# Patient Record
Sex: Female | Born: 1955 | Race: Black or African American | Hispanic: No | Marital: Single | Smoking: Never smoker
Health system: Southern US, Community
[De-identification: ages and names within clinical notes are randomized; demographics above are authoritative.]

## PROBLEM LIST (undated history)

## (undated) DIAGNOSIS — D649 Anemia, unspecified: Secondary | ICD-10-CM

## (undated) DIAGNOSIS — R079 Chest pain, unspecified: Secondary | ICD-10-CM

## (undated) DIAGNOSIS — Z86018 Personal history of other benign neoplasm: Secondary | ICD-10-CM

## (undated) DIAGNOSIS — K219 Gastro-esophageal reflux disease without esophagitis: Secondary | ICD-10-CM

## (undated) DIAGNOSIS — M79605 Pain in left leg: Secondary | ICD-10-CM

## (undated) DIAGNOSIS — M79604 Pain in right leg: Secondary | ICD-10-CM

## (undated) DIAGNOSIS — R0602 Shortness of breath: Secondary | ICD-10-CM

## (undated) DIAGNOSIS — Z8711 Personal history of peptic ulcer disease: Secondary | ICD-10-CM

## (undated) DIAGNOSIS — R519 Headache, unspecified: Secondary | ICD-10-CM

## (undated) DIAGNOSIS — M25474 Effusion, right foot: Secondary | ICD-10-CM

## (undated) DIAGNOSIS — R7303 Prediabetes: Secondary | ICD-10-CM

## (undated) DIAGNOSIS — K5900 Constipation, unspecified: Secondary | ICD-10-CM

## (undated) DIAGNOSIS — M25472 Effusion, left ankle: Secondary | ICD-10-CM

## (undated) DIAGNOSIS — R5383 Other fatigue: Secondary | ICD-10-CM

## (undated) DIAGNOSIS — M25475 Effusion, left foot: Secondary | ICD-10-CM

## (undated) HISTORY — DX: Effusion, left ankle: M25.472

## (undated) HISTORY — DX: Pain in right leg: M79.604

## (undated) HISTORY — DX: Personal history of peptic ulcer disease: Z87.11

## (undated) HISTORY — DX: Headache, unspecified: R51.9

## (undated) HISTORY — DX: Gastro-esophageal reflux disease without esophagitis: K21.9

## (undated) HISTORY — DX: Effusion, right foot: M25.474

## (undated) HISTORY — DX: Pain in right leg: M79.605

## (undated) HISTORY — PX: BREAST LUMPECTOMY: SHX2

## (undated) HISTORY — DX: Prediabetes: R73.03

## (undated) HISTORY — DX: Shortness of breath: R06.02

## (undated) HISTORY — DX: Personal history of other benign neoplasm: Z86.018

## (undated) HISTORY — DX: Constipation, unspecified: K59.00

## (undated) HISTORY — DX: Chest pain, unspecified: R07.9

## (undated) HISTORY — PX: CYSTECTOMY: SUR359

## (undated) HISTORY — DX: Anemia, unspecified: D64.9

## (undated) HISTORY — DX: Other fatigue: R53.83

## (undated) HISTORY — DX: Effusion, left foot: M25.475

## (undated) HISTORY — PX: LAPAROSCOPIC VAGINAL HYSTERECTOMY: SUR798

---

## 1998-04-22 ENCOUNTER — Ambulatory Visit (HOSPITAL_COMMUNITY): Admission: RE | Admit: 1998-04-22 | Discharge: 1998-04-22 | Payer: Self-pay | Admitting: Family Medicine

## 1998-04-22 ENCOUNTER — Encounter: Payer: Self-pay | Admitting: Family Medicine

## 1999-03-15 ENCOUNTER — Encounter (HOSPITAL_BASED_OUTPATIENT_CLINIC_OR_DEPARTMENT_OTHER): Payer: Self-pay | Admitting: General Surgery

## 1999-03-15 ENCOUNTER — Encounter: Admission: RE | Admit: 1999-03-15 | Discharge: 1999-03-15 | Payer: Self-pay | Admitting: General Surgery

## 1999-03-16 ENCOUNTER — Ambulatory Visit (HOSPITAL_BASED_OUTPATIENT_CLINIC_OR_DEPARTMENT_OTHER): Admission: RE | Admit: 1999-03-16 | Discharge: 1999-03-16 | Payer: Self-pay | Admitting: General Surgery

## 2003-10-28 ENCOUNTER — Emergency Department (HOSPITAL_COMMUNITY): Admission: EM | Admit: 2003-10-28 | Discharge: 2003-10-28 | Payer: Self-pay | Admitting: *Deleted

## 2004-08-27 ENCOUNTER — Other Ambulatory Visit: Admission: RE | Admit: 2004-08-27 | Discharge: 2004-08-27 | Payer: Self-pay | Admitting: Obstetrics and Gynecology

## 2005-08-24 ENCOUNTER — Encounter: Admission: RE | Admit: 2005-08-24 | Discharge: 2005-08-24 | Payer: Self-pay | Admitting: Occupational Medicine

## 2013-07-23 ENCOUNTER — Ambulatory Visit (INDEPENDENT_AMBULATORY_CARE_PROVIDER_SITE_OTHER): Payer: BC Managed Care – PPO | Admitting: Gynecology

## 2013-07-23 ENCOUNTER — Encounter: Payer: Self-pay | Admitting: Gynecology

## 2013-07-23 VITALS — BP 126/78 | Resp 18 | Ht 64.5 in | Wt 204.0 lb

## 2013-07-23 DIAGNOSIS — N951 Menopausal and female climacteric states: Secondary | ICD-10-CM

## 2013-07-23 DIAGNOSIS — N898 Other specified noninflammatory disorders of vagina: Secondary | ICD-10-CM

## 2013-07-23 DIAGNOSIS — Z01419 Encounter for gynecological examination (general) (routine) without abnormal findings: Secondary | ICD-10-CM

## 2013-07-23 DIAGNOSIS — Z1211 Encounter for screening for malignant neoplasm of colon: Secondary | ICD-10-CM

## 2013-07-23 LAB — POCT WET PREP (WET MOUNT): Clue Cells Wet Prep Whiff POC: POSITIVE

## 2013-07-23 MED ORDER — VENLAFAXINE HCL ER 37.5 MG PO CP24: 37.5000 mg | ORAL_CAPSULE | Freq: Every day | ORAL | Status: DC

## 2013-07-23 MED ORDER — TINIDAZOLE 500 MG PO TABS: 2.0000 g | ORAL_TABLET | Freq: Once | ORAL | Status: DC

## 2013-07-23 NOTE — Patient Instructions (Signed)

## 2013-07-23 NOTE — Progress Notes (Signed)
58 y.o. Single African American female   G1P1001 here for annual exam. Pt reports menses are absent due to hysterectomy. She does report hot flashes, does have night sweats, does not have vaginal dryness.  She is not using lubricants.  She does not report post-menopasual bleeding.  Pt is s/p LAVH for fibroids but has ovaries in place.  Pt has hot flashes over 10y, never on hormones.    Patient's last menstrual period was 07/24/2003.          Sexually active: yes The current method of family planning is status post hysterectomy.    Exercising: no  The patient does not participate in regular exercise at present. Last pap: 2 years ago Normal  Abnormal PAP: no Mammogram: 2013- Normal  BSE: no Colonoscopy:  None  DEXA: none Alcohol: no  Tobacco: no  Hgb: Work ; Urine: Trace Blood  Health Maintenance  Topic Date Due  . Pap Smear  09/05/1971  . Tetanus/tdap  09/04/1972  . Mammogram  09/05/2003  . Colonoscopy  09/05/2003  . Influenza Vaccine  12/21/2012    Family History  Problem Relation Age of Onset  . Leukemia Maternal Uncle   . Diabetes Maternal Uncle   . Diabetes Mother     There are no active problems to display for this patient.   Past Medical History  Diagnosis Date  . Anemia   . Pre-diabetes   . History of uterine fibroid     Past Surgical History  Procedure Laterality Date  . Laparoscopic vaginal hysterectomy  15 years ago    due to fibroids  . Cystectomy  10 years ago    wrist   . Breast lumpectomy Right 10+ years ago    Benign    Allergies: Review of patient's allergies indicates no known allergies.  Current Outpatient Prescriptions  Medication Sig Dispense Refill  . IBUPROFEN PO Take 200 mg by mouth as needed.        No current facility-administered medications for this visit.    ROS: Pertinent items are noted in HPI.  Exam:    Ht 5' 4.5" (1.638 m)  Wt 204 lb (92.534 kg)  BMI 34.49 kg/m2  LMP 07/24/2003 Weight change: @WEIGHTCHANGE @ Last 3  height recordings:  Ht Readings from Last 3 Encounters:  07/23/13 5' 4.5" (1.638 m)   General appearance: alert, cooperative and appears stated age Head: Normocephalic, without obvious abnormality, atraumatic Neck: no adenopathy, no carotid bruit, no JVD, supple, symmetrical, trachea midline and thyroid not enlarged, symmetric, no tenderness/mass/nodules Lungs: clear to auscultation bilaterally Breasts: normal appearance, no masses or tenderness Heart: regular rate and rhythm, S1, S2 normal, no murmur, click, rub or gallop Abdomen: soft, non-tender; bowel sounds normal; no masses,  no organomegaly Extremities: extremities normal, atraumatic, no cyanosis or edema Skin: Skin color, texture, turgor normal. No rashes or lesions Lymph nodes: Cervical, supraclavicular, and axillary nodes normal. no inguinal nodes palpated Neurologic: Grossly normal   Pelvic: External genitalia:  no lesions              Urethra: normal appearing urethra with no masses, tenderness or lesions              Bartholins and Skenes: Bartholin's, Urethra, Skene's normal                 Vagina: normal appearing vagina with normal color, no lesions, vaginal discharge noted  WET MOUNT done - results: clue cells, pH >5.5  Cervix: absent              Pap taken: no        Bimanual Exam:  Uterus:  absent                                      Adnexa:    no masses                                      Rectovaginal: Confirms                                      Anus:  normal sphincter tone, no lesions  A: well woman BV Menopausal symptoms     P: mammogram pap smear not done Referral for colon cancer screening BV: tindamax We reviewed the recommendations of ACOG and WHI re HRT, risks and benefits reviewed, affects on bone, colon, cholesterol and menopausal symptoms discussed.  We also discussed non-hormonal options such as SSRI's, SSRI/SNRI's such as effexor and mechanism of action.  Questions addressed.  Pt  chooses to try effexor. counseled on breast self exam, mammography screening, adequate intake of calcium and vitamin D, diet and exercise return annually or prn Discussed PAP guideline changes, importance of weight bearing exercises, calcium, vit D and balanced diet.  An After Visit Summary was printed and given to the patient.

## 2013-07-26 ENCOUNTER — Telehealth: Payer: Self-pay | Admitting: Gynecology

## 2013-07-26 NOTE — Telephone Encounter (Signed)
Patient is scheduled with Dr. Loreta AveMann 07/29/2013 @ 3:15. Patient advised.

## 2013-08-27 ENCOUNTER — Other Ambulatory Visit: Payer: Self-pay | Admitting: Family Medicine

## 2013-08-27 ENCOUNTER — Other Ambulatory Visit: Payer: Self-pay | Admitting: Family

## 2013-08-27 ENCOUNTER — Other Ambulatory Visit: Payer: Self-pay | Admitting: Emergency Medicine

## 2013-08-27 DIAGNOSIS — Z1231 Encounter for screening mammogram for malignant neoplasm of breast: Secondary | ICD-10-CM

## 2013-08-27 DIAGNOSIS — Z780 Asymptomatic menopausal state: Secondary | ICD-10-CM

## 2013-09-12 ENCOUNTER — Ambulatory Visit
Admission: RE | Admit: 2013-09-12 | Discharge: 2013-09-12 | Disposition: A | Discharge status: Home / Self Care | Payer: Self-pay | Source: Ambulatory Visit | Attending: Family Medicine | Admitting: Family Medicine

## 2013-09-12 ENCOUNTER — Ambulatory Visit
Admission: RE | Admit: 2013-09-12 | Discharge: 2013-09-12 | Disposition: A | Discharge status: Home / Self Care | Payer: Self-pay | Source: Ambulatory Visit | Attending: Family | Admitting: Family

## 2013-09-12 DIAGNOSIS — Z1231 Encounter for screening mammogram for malignant neoplasm of breast: Secondary | ICD-10-CM

## 2013-09-12 DIAGNOSIS — Z780 Asymptomatic menopausal state: Secondary | ICD-10-CM

## 2014-03-07 ENCOUNTER — Other Ambulatory Visit: Payer: Self-pay

## 2014-03-24 ENCOUNTER — Encounter: Payer: Self-pay | Admitting: Gynecology

## 2014-07-25 ENCOUNTER — Encounter: Payer: Self-pay | Admitting: Obstetrics and Gynecology

## 2014-07-25 ENCOUNTER — Ambulatory Visit: Payer: BC Managed Care – PPO | Admitting: Obstetrics and Gynecology

## 2017-08-06 ENCOUNTER — Ambulatory Visit (HOSPITAL_COMMUNITY)
Admission: EM | Admit: 2017-08-06 | Discharge: 2017-08-06 | Disposition: A | Discharge status: Home / Self Care | Payer: BLUE CROSS/BLUE SHIELD | Attending: Internal Medicine | Admitting: Internal Medicine

## 2017-08-06 ENCOUNTER — Encounter (HOSPITAL_COMMUNITY): Payer: Self-pay | Admitting: *Deleted

## 2017-08-06 DIAGNOSIS — R0981 Nasal congestion: Secondary | ICD-10-CM

## 2017-08-06 DIAGNOSIS — R05 Cough: Secondary | ICD-10-CM

## 2017-08-06 DIAGNOSIS — R059 Cough, unspecified: Secondary | ICD-10-CM

## 2017-08-06 DIAGNOSIS — R062 Wheezing: Secondary | ICD-10-CM

## 2017-08-06 DIAGNOSIS — H109 Unspecified conjunctivitis: Secondary | ICD-10-CM

## 2017-08-06 MED ORDER — AZITHROMYCIN 250 MG PO TABS: ORAL_TABLET | ORAL | 0 refills | Status: DC

## 2017-08-06 MED ORDER — BENZONATATE 100 MG PO CAPS: 100.0000 mg | ORAL_CAPSULE | Freq: Three times a day (TID) | ORAL | 0 refills | Status: DC | PRN

## 2017-08-06 MED ORDER — HYDROCODONE-HOMATROPINE 5-1.5 MG/5ML PO SYRP: 5.0000 mL | ORAL_SOLUTION | Freq: Every evening | ORAL | 0 refills | Status: DC | PRN

## 2017-08-06 MED ORDER — ALBUTEROL SULFATE HFA 108 (90 BASE) MCG/ACT IN AERS: 1.0000 | INHALATION_SPRAY | Freq: Four times a day (QID) | RESPIRATORY_TRACT | 0 refills | Status: DC | PRN

## 2017-08-06 MED ORDER — ERYTHROMYCIN 5 MG/GM OP OINT: TOPICAL_OINTMENT | Freq: Four times a day (QID) | OPHTHALMIC | 0 refills | Status: DC

## 2017-08-06 NOTE — Discharge Instructions (Signed)
Hydrate well with at least 2 liters (1 gallon) of water daily. Take your allergy medications.

## 2017-08-06 NOTE — ED Notes (Signed)
Pt unable to read eye chart, normally wears contacts but took them out.

## 2017-08-06 NOTE — ED Provider Notes (Signed)
  MRN: [id number] DOB: 23-Jan-1956  Subjective:   Julie Flowers is a 62 y.o. female presenting for 3 week history of worsening productive cough that elicits nausea, mild wheezing, now having red right eye, right eye drainage, matted right eye, sinus congestion, sore throat. Has tried Robitussin, Alka-seltzer Plus, Metamucil. Denies history of asthma. Denies smoking cigarettes.   Julie Flowers has No Known Allergies.  Julie Flowers  has a past medical history of Anemia, History of uterine fibroid, and Pre-diabetes. Also  has a past surgical history that includes Laparoscopic vaginal hysterectomy (15 years ago); Cystectomy (10 years ago); and Breast lumpectomy (Right, 10+ years ago).  Objective:   Vitals: BP (!) 120/53 (BP Location: Left Arm)   Pulse 70   Temp 98.8 F (37.1 C) (Oral)   Resp 17   LMP 07/24/2003   SpO2 100%   Physical Exam  Constitutional: She is oriented to person, place, and time. She appears well-developed and well-nourished.  HENT:  Right Ear: Tympanic membrane normal.  Left Ear: Tympanic membrane normal.  Nose: No sinus tenderness.  Mouth/Throat: Oropharynx is clear and moist.  Eyes: Right eye exhibits discharge (with associated injected conjunctiva). Left eye exhibits no discharge.  Neck: Normal range of motion. Neck supple.  Cardiovascular: Normal rate, regular rhythm and intact distal pulses. Exam reveals no gallop and no friction rub.  No murmur heard. Pulmonary/Chest: No respiratory distress. She has no wheezes. She has no rales.  Coarse lung sounds over left lower base.   Musculoskeletal: She exhibits no edema.  Lymphadenopathy:    She has no cervical adenopathy.  Neurological: She is alert and oriented to person, place, and time.  Skin: Skin is warm and dry.  Psychiatric: She has a normal mood and affect.   Assessment and Plan :   Bacterial conjunctivitis of right eye  Cough  Sinus congestion  Wheezing  Start azithromycin for lower respiratory infection. Use  cough suppression medications, albuterol inhaler for wheezing. Start erythromycin for bacterial conjunctivitis of right eye. Return-to-clinic precautions discussed, patient verbalized understanding.   Wallis BambergMani, Naiyah Klostermann, [place]PA-C 08/06/17 2117

## 2017-08-06 NOTE — ED Triage Notes (Signed)
Patient reports she has had nasal congestion/drainage and cough x 3 weeks. This am woke up with redness and drainage to right. Reports blurry vision and burning to right eye.

## 2017-09-02 ENCOUNTER — Other Ambulatory Visit: Payer: Self-pay | Admitting: Urgent Care

## 2017-09-04 NOTE — Telephone Encounter (Signed)
Tried to call pt to schedule an appt to establish care for the refill. Left VM for pt to call the office and schedule an appt.

## 2018-08-13 DIAGNOSIS — H43812 Vitreous degeneration, left eye: Secondary | ICD-10-CM | POA: Diagnosis not present

## 2018-08-13 DIAGNOSIS — H2513 Age-related nuclear cataract, bilateral: Secondary | ICD-10-CM | POA: Diagnosis not present

## 2018-08-13 DIAGNOSIS — H25013 Cortical age-related cataract, bilateral: Secondary | ICD-10-CM | POA: Diagnosis not present

## 2018-12-10 DIAGNOSIS — H5213 Myopia, bilateral: Secondary | ICD-10-CM | POA: Diagnosis not present

## 2018-12-10 DIAGNOSIS — H524 Presbyopia: Secondary | ICD-10-CM | POA: Diagnosis not present

## 2018-12-10 DIAGNOSIS — H04129 Dry eye syndrome of unspecified lacrimal gland: Secondary | ICD-10-CM | POA: Diagnosis not present

## 2018-12-10 DIAGNOSIS — H25813 Combined forms of age-related cataract, bilateral: Secondary | ICD-10-CM | POA: Diagnosis not present

## 2019-06-12 ENCOUNTER — Ambulatory Visit: Payer: BC Managed Care – PPO | Attending: Internal Medicine

## 2019-06-12 DIAGNOSIS — U071 COVID-19: Secondary | ICD-10-CM | POA: Insufficient documentation

## 2019-06-12 DIAGNOSIS — Z20822 Contact with and (suspected) exposure to covid-19: Secondary | ICD-10-CM

## 2019-06-13 LAB — NOVEL CORONAVIRUS, NAA: SARS-CoV-2, NAA: DETECTED — AB

## 2019-06-15 ENCOUNTER — Inpatient Hospital Stay (HOSPITAL_COMMUNITY)
Admission: EM | Admit: 2019-06-15 | Discharge: 2019-06-18 | DRG: 177 | Disposition: A | Discharge status: Home / Self Care | Payer: BC Managed Care – PPO | Attending: Internal Medicine | Admitting: Internal Medicine

## 2019-06-15 ENCOUNTER — Other Ambulatory Visit: Payer: Self-pay

## 2019-06-15 ENCOUNTER — Emergency Department (HOSPITAL_COMMUNITY): Payer: BC Managed Care – PPO

## 2019-06-15 DIAGNOSIS — N179 Acute kidney failure, unspecified: Secondary | ICD-10-CM | POA: Diagnosis present

## 2019-06-15 DIAGNOSIS — J45909 Unspecified asthma, uncomplicated: Secondary | ICD-10-CM | POA: Diagnosis not present

## 2019-06-15 DIAGNOSIS — Z833 Family history of diabetes mellitus: Secondary | ICD-10-CM

## 2019-06-15 DIAGNOSIS — J9601 Acute respiratory failure with hypoxia: Secondary | ICD-10-CM | POA: Diagnosis present

## 2019-06-15 DIAGNOSIS — R0902 Hypoxemia: Secondary | ICD-10-CM

## 2019-06-15 DIAGNOSIS — J069 Acute upper respiratory infection, unspecified: Secondary | ICD-10-CM | POA: Diagnosis present

## 2019-06-15 DIAGNOSIS — R430 Anosmia: Secondary | ICD-10-CM | POA: Diagnosis not present

## 2019-06-15 DIAGNOSIS — R0602 Shortness of breath: Secondary | ICD-10-CM | POA: Diagnosis not present

## 2019-06-15 DIAGNOSIS — I10 Essential (primary) hypertension: Secondary | ICD-10-CM | POA: Diagnosis present

## 2019-06-15 DIAGNOSIS — J4522 Mild intermittent asthma with status asthmaticus: Secondary | ICD-10-CM | POA: Diagnosis not present

## 2019-06-15 DIAGNOSIS — U071 COVID-19: Principal | ICD-10-CM | POA: Diagnosis present

## 2019-06-15 DIAGNOSIS — Z6835 Body mass index (BMI) 35.0-35.9, adult: Secondary | ICD-10-CM | POA: Diagnosis not present

## 2019-06-15 DIAGNOSIS — E669 Obesity, unspecified: Secondary | ICD-10-CM | POA: Diagnosis not present

## 2019-06-15 DIAGNOSIS — R509 Fever, unspecified: Secondary | ICD-10-CM | POA: Diagnosis not present

## 2019-06-15 DIAGNOSIS — E860 Dehydration: Secondary | ICD-10-CM | POA: Diagnosis not present

## 2019-06-15 DIAGNOSIS — J1282 Pneumonia due to coronavirus disease 2019: Secondary | ICD-10-CM | POA: Diagnosis present

## 2019-06-15 LAB — C-REACTIVE PROTEIN: CRP: 25.3 mg/dL — ABNORMAL HIGH (ref <1.0)

## 2019-06-15 LAB — COMPREHENSIVE METABOLIC PANEL
ALT: 16 U/L (ref 0–44)
AST: 32 U/L (ref 15–41)
Albumin: 3 g/dL — ABNORMAL LOW (ref 3.5–5.0)
Alkaline Phosphatase: 62 U/L (ref 38–126)
Anion gap: 13 (ref 5–15)
BUN: 10 mg/dL (ref 8–23)
CO2: 24 mmol/L (ref 22–32)
Calcium: 9 mg/dL (ref 8.9–10.3)
Chloride: 104 mmol/L (ref 98–111)
Creatinine, Ser: 1.11 mg/dL — ABNORMAL HIGH (ref 0.44–1.00)
GFR calc Af Amer: >60 mL/min (ref >60)
GFR calc non Af Amer: 52 mL/min — ABNORMAL LOW (ref >60)
Glucose, Bld: 135 mg/dL — ABNORMAL HIGH (ref 70–99)
Potassium: 3.7 mmol/L (ref 3.5–5.1)
Sodium: 141 mmol/L (ref 135–145)
Total Bilirubin: 0.7 mg/dL (ref 0.3–1.2)
Total Protein: 8 g/dL (ref 6.5–8.1)

## 2019-06-15 LAB — CBC WITH DIFFERENTIAL/PLATELET
Abs Immature Granulocytes: 0.25 10*3/uL — ABNORMAL HIGH (ref 0.00–0.07)
Basophils Absolute: 0.1 10*3/uL (ref 0.0–0.1)
Basophils Relative: 0 %
Eosinophils Absolute: 0 10*3/uL (ref 0.0–0.5)
Eosinophils Relative: 0 %
HCT: 38.1 % (ref 36.0–46.0)
Hemoglobin: 12.8 g/dL (ref 12.0–15.0)
Immature Granulocytes: 2 %
Lymphocytes Relative: 12 %
Lymphs Abs: 1.6 10*3/uL (ref 0.7–4.0)
MCH: 29.2 pg (ref 26.0–34.0)
MCHC: 33.6 g/dL (ref 30.0–36.0)
MCV: 87 fL (ref 80.0–100.0)
Monocytes Absolute: 0.5 10*3/uL (ref 0.1–1.0)
Monocytes Relative: 4 %
Neutro Abs: 11.3 10*3/uL — ABNORMAL HIGH (ref 1.7–7.7)
Neutrophils Relative %: 82 %
Platelets: 320 10*3/uL (ref 150–400)
RBC: 4.38 MIL/uL (ref 3.87–5.11)
RDW: 13.5 % (ref 11.5–15.5)
WBC: 13 10*3/uL — ABNORMAL HIGH (ref 4.0–10.5)
nRBC: 0 % (ref 0.0–0.2)

## 2019-06-15 LAB — TRIGLYCERIDES: Triglycerides: 129 mg/dL (ref <150)

## 2019-06-15 LAB — D-DIMER, QUANTITATIVE (NOT AT ARMC): D-Dimer, Quant: 2.34 ug/mL-FEU — ABNORMAL HIGH (ref 0.00–0.50)

## 2019-06-15 LAB — FIBRINOGEN: Fibrinogen: >800 mg/dL — ABNORMAL HIGH (ref 210–475)

## 2019-06-15 LAB — LACTIC ACID, PLASMA
Lactic Acid, Venous: 1.6 mmol/L (ref 0.5–1.9)
Lactic Acid, Venous: 0.9 mmol/L (ref 0.5–1.9)

## 2019-06-15 LAB — ABO/RH: ABO/RH(D): O POS

## 2019-06-15 LAB — LACTATE DEHYDROGENASE: LDH: 360 U/L — ABNORMAL HIGH (ref 98–192)

## 2019-06-15 LAB — PROCALCITONIN: Procalcitonin: 0.12 ng/mL

## 2019-06-15 MED ORDER — DEXAMETHASONE SODIUM PHOSPHATE 10 MG/ML IJ SOLN: 6.0000 mg | INTRAMUSCULAR | Status: DC

## 2019-06-15 MED ORDER — SODIUM CHLORIDE 0.9 % IV SOLN
100.0000 mg | Freq: Every day | INTRAVENOUS | Status: DC
  Administered 2019-06-16: 100 mg via INTRAVENOUS
  Filled 2019-06-15 (×2): qty 20

## 2019-06-15 MED ORDER — SODIUM CHLORIDE 0.9% FLUSH
3.0000 mL | Freq: Two times a day (BID) | INTRAVENOUS | Status: DC
  Administered 2019-06-16 – 2019-06-18 (×6): 3 mL via INTRAVENOUS

## 2019-06-15 MED ORDER — ENOXAPARIN SODIUM 40 MG/0.4ML ~~LOC~~ SOLN: 40.0000 mg | SUBCUTANEOUS | Status: DC

## 2019-06-15 MED ORDER — ALBUTEROL SULFATE HFA 108 (90 BASE) MCG/ACT IN AERS
1.0000 | INHALATION_SPRAY | Freq: Four times a day (QID) | RESPIRATORY_TRACT | Status: DC | PRN
  Filled 2019-06-15: qty 6.7

## 2019-06-15 MED ORDER — SODIUM CHLORIDE 0.9 % IV SOLN
1.0000 g | Freq: Once | INTRAVENOUS | Status: AC
  Administered 2019-06-15: 22:00:00 1 g via INTRAVENOUS
  Filled 2019-06-15: qty 10

## 2019-06-15 MED ORDER — ACETAMINOPHEN 500 MG PO TABS
1000.0000 mg | ORAL_TABLET | Freq: Once | ORAL | Status: AC
  Administered 2019-06-15: 21:00:00 1000 mg via ORAL
  Filled 2019-06-15: qty 2

## 2019-06-15 MED ORDER — VENLAFAXINE HCL ER 37.5 MG PO CP24
37.5000 mg | ORAL_CAPSULE | Freq: Every day | ORAL | Status: DC
  Administered 2019-06-16 – 2019-06-18 (×3): 37.5 mg via ORAL
  Filled 2019-06-15 (×4): qty 1

## 2019-06-15 MED ORDER — ZINC SULFATE 220 (50 ZN) MG PO CAPS
220.0000 mg | ORAL_CAPSULE | Freq: Every day | ORAL | Status: DC
  Administered 2019-06-16 – 2019-06-18 (×3): 220 mg via ORAL
  Filled 2019-06-15 (×3): qty 1

## 2019-06-15 MED ORDER — SODIUM CHLORIDE 0.9 % IV SOLN
500.0000 mg | Freq: Once | INTRAVENOUS | Status: AC
  Administered 2019-06-15: 500 mg via INTRAVENOUS
  Filled 2019-06-15: qty 500

## 2019-06-15 MED ORDER — LACTATED RINGERS IV BOLUS
500.0000 mL | Freq: Once | INTRAVENOUS | Status: AC
  Administered 2019-06-15: 500 mL via INTRAVENOUS

## 2019-06-15 MED ORDER — HYDROCODONE-ACETAMINOPHEN 5-325 MG PO TABS: 1.0000 | ORAL_TABLET | ORAL | Status: DC | PRN

## 2019-06-15 MED ORDER — ACETAMINOPHEN 325 MG PO TABS: 650.0000 mg | ORAL_TABLET | Freq: Four times a day (QID) | ORAL | Status: DC | PRN

## 2019-06-15 MED ORDER — SODIUM CHLORIDE 0.9 % IV SOLN: INTRAVENOUS | Status: AC

## 2019-06-15 MED ORDER — ONDANSETRON HCL 4 MG/2ML IJ SOLN: 4.0000 mg | Freq: Four times a day (QID) | INTRAMUSCULAR | Status: DC | PRN

## 2019-06-15 MED ORDER — GUAIFENESIN-DM 100-10 MG/5ML PO SYRP
10.0000 mL | ORAL_SOLUTION | ORAL | Status: DC | PRN
  Administered 2019-06-16 – 2019-06-17 (×3): 10 mL via ORAL
  Filled 2019-06-15 (×3): qty 10

## 2019-06-15 MED ORDER — SODIUM CHLORIDE 0.9 % IV SOLN
200.0000 mg | Freq: Once | INTRAVENOUS | Status: AC
  Administered 2019-06-15: 200 mg via INTRAVENOUS
  Filled 2019-06-15: qty 40

## 2019-06-15 MED ORDER — ASCORBIC ACID 500 MG PO TABS
500.0000 mg | ORAL_TABLET | Freq: Every day | ORAL | Status: DC
  Administered 2019-06-16 – 2019-06-18 (×3): 500 mg via ORAL
  Filled 2019-06-15 (×3): qty 1

## 2019-06-15 MED ORDER — DEXAMETHASONE SODIUM PHOSPHATE 10 MG/ML IJ SOLN
10.0000 mg | Freq: Once | INTRAMUSCULAR | Status: AC
  Administered 2019-06-15: 10 mg via INTRAVENOUS
  Filled 2019-06-15: qty 1

## 2019-06-15 MED ORDER — ONDANSETRON HCL 4 MG PO TABS: 4.0000 mg | ORAL_TABLET | Freq: Four times a day (QID) | ORAL | Status: DC | PRN

## 2019-06-15 NOTE — H&P (Signed)
Julie Flowers HGD:[id number] DOB: 04/27/1956 DOA: 06/15/2019     PCP: Patient, No Pcp Per   Outpatient Specialists:  NONE    Patient arrived to ER on 06/15/19 at 2015  Patient coming from: home Lives  With family    Chief Complaint:  Cough shortness of breath fever HPI: Julie Flowers is a 64 y.o. 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  Bilateral infiltrates on chest x-ray consistent with COVID-19   Pneumonia    WBC 13  The following Work up has been ordered so far:  Orders Placed This Encounter  Procedures  . Blood Culture (routine x 2)  . DG Chest Port 1 View  . Lactic acid, plasma  . CBC WITH DIFFERENTIAL  . Comprehensive metabolic panel  . D-dimer, quantitative  . Procalcitonin  . Lactate dehydrogenase  . Triglycerides  . Fibrinogen  . C-reactive protein  . Diet NPO time specified  . Cardiac monitoring  . Insert peripheral IV x 2  . Initiate Carrier Fluid Protocol  . Place surgical mask on patient  . Patient to wear surgical mask during transportation  . Assess patient for ability  to self-prone. If able (can move self in bed, ambulate) and stable (SpO2 and oxygen requirement):  . RN/NT - Document specific oxygen requirements in CHL  . Notify EDP if new oxygen requirements escalates > 4L per minute [place]  . RN to draw the following extra tubes:  . remdesivir per pharmacy consult  . Consult to hospitalist  ALL PATIENTS BEING ADMITTED/HAVING PROCEDURES NEED COVID-19 SCREENING  . Airborne and Contact precautions  . Pulse oximetry, continuous  . I-Stat arterial blood gas, ED  . ED EKG 12-Lead     Following Medications were ordered in ER: Medications  remdesivir 200 mg in sodium chloride 0.9% 250 mL IVPB (0 mg Intravenous Stopped 06/15/19 2211)    Followed by  remdesivir 100 mg in sodium chloride 0.9 % 100 mL IVPB (has no administration in time range)  cefTRIAXone (ROCEPHIN) 1 g in sodium chloride 0.9 % 100 mL IVPB (1 g Intravenous New Bag/Given 06/15/19 2220)  azithromycin (ZITHROMAX) 500 mg in sodium chloride 0.9 % 250 mL IVPB (500 mg Intravenous New Bag/Given 06/15/19 2220)  acetaminophen (TYLENOL) tablet 1,000 mg (1,000 mg Oral Given 06/15/19 2043)  lactated ringers bolus 500 mL (0 mLs Intravenous Stopped 06/15/19 2211)  dexamethasone (DECADRON) injection 10 mg (10 mg Intravenous Given 06/15/19 2118)        Consult Orders  (From admission, onward)  Start     Ordered   06/15/19 2210  Consult to hospitalist  ALL PATIENTS BEING ADMITTED/HAVING PROCEDURES NEED COVID-19 SCREENING Paged [facility], roxanne  Once    Comments: ALL PATIENTS BEING ADMITTED/HAVING PROCEDURES NEED COVID-19 SCREENING  Provider:  (Not yet assigned)  Question Answer Comment  Place call to: [facility]   Reason for Consult Admit      06/15/19 2209          Significant initial  Findings: Abnormal Labs Reviewed  CBC WITH DIFFERENTIAL/PLATELET - Abnormal; Notable for the following components:      Result Value   WBC 13.0 (*)    All other components within normal limits   COMPREHENSIVE METABOLIC PANEL - Abnormal; Notable for the following components:   Glucose, Bld 135 (*)    Creatinine, Ser 1.11 (*)    Albumin 3.0 (*)    GFR calc non Af Amer 52 (*)    All other components within normal limits  D-DIMER, QUANTITATIVE (NOT AT [facility]) - Abnormal; Notable for the following components:   D-Dimer, Quant 2.34 (*)    All other components within normal limits  LACTATE DEHYDROGENASE - Abnormal; Notable for the following components:   LDH 360 (*)    All other components within normal limits  FIBRINOGEN - Abnormal; Notable for the following components:   Fibrinogen >800 (*)    All other components within normal limits    Otherwise labs showing:    Recent Labs  Lab 06/15/19 2033  NA 141  K 3.7  CO2 24  GLUCOSE 135*  BUN 10  CREATININE 1.11*  CALCIUM 9.0    Cr    Lab Results  Component Value Date   CREATININE 1.11 (H) 06/15/2019    Recent Labs  Lab 06/15/19 2033  AST 32  ALT 16  ALKPHOS 62  BILITOT 0.7  PROT 8.0  ALBUMIN 3.0*   Lab Results  Component Value Date   CALCIUM 9.0 06/15/2019      WBC      Component Value Date/Time   WBC 13.0 (H) 06/15/2019 2033   ANC    Component Value Date/Time   NEUTROABS PENDING 06/15/2019 2033   ALC No components found for: LYMPHAB    Plt: Lab Results  Component Value Date   PLT 320 06/15/2019     Lactic Acid, Venous    Component Value Date/Time   LATICACIDVEN 1.6 06/15/2019 2033    Procalcitonin 0.12  COVID-19 Labs  Recent Labs    06/15/19 2033  DDIMER 2.34*  LDH 360*    Lab Results  Component Value Date   SARSCOV2NAA Detected (A) 06/12/2019     HG/HCT  stable,      Component Value Date/Time   HGB 12.8 06/15/2019 2033   HCT 38.1 06/15/2019 2033     Troponin 7     ECG: Ordered Personally reviewed by me showing: HR :74 Rhythm:  NSR,     no evidence of ischemic changes QTC 434    UA not ordered      Ordered   CXR - COVID-19   Pneumonia     ED Triage  Vitals  Enc Vitals Group     BP 06/15/19 2024 (!) 143/63     Pulse --      Resp 06/15/19 2024 (!) 24     Temp 06/15/19 2024 (!) 101.8 F (38.8 C)     Temp Source 06/15/19 2024 Oral     SpO2 06/15/19 2024 95 %  Weight 06/15/19 2025 214 lb (97.1 kg)     Height 06/15/19 2025 5' 4.75" (1.645 m)     Head Circumference --      Peak Flow --      Pain Score --      Pain Loc --      Pain Edu? --      Excl. in GC? --   TMAX(24)@       Latest  Blood pressure (!) 143/63, temperature (!) 101.8 F (38.8 C), temperature source Oral, resp. rate (!) 24, height 5' 4.75" (1.645 m), weight 97.1 kg, last menstrual period 07/24/2003, SpO2 95 %.    Hospitalist was called for admission for acute respiratory failure with hypoxia in the setting of Covid pneumonia   Review of Systems:    Pertinent positives include:  Fevers, chills, fatigue  shortness of breath at rest.   dyspnea on exertion,  Constitutional:  No weight loss, night sweats, weight loss  HEENT:  No headaches, Difficulty swallowing,Tooth/dental problems,Sore throat,  No sneezing, itching, ear ache, nasal congestion, post nasal drip,  Cardio-vascular:  No chest pain, Orthopnea, PND, anasarca, dizziness, palpitations.no Bilateral lower extremity swelling  GI:  No heartburn, indigestion, abdominal pain, nausea, vomiting, diarrhea, change in bowel habits, loss of appetite, melena, blood in stool, hematemesis Resp:  no  No excess mucus, no productive cough, No non-productive cough, No coughing up of blood.No change in color of mucus.No wheezing. Skin:  no rash or lesions. No jaundice GU:  no dysuria, change in color of urine, no urgency or frequency. No straining to urinate.  No flank pain.  Musculoskeletal:  No joint pain or no joint swelling. No decreased range of motion. No back pain.  Psych:  No change in mood or affect. No depression or anxiety. No memory loss.  Neuro: no localizing neurological complaints, no tingling, no  weakness, no double vision, no gait abnormality, no slurred speech, no confusion  All systems reviewed and apart from HOPI all are negative  Past Medical History:   Past Medical History:  Diagnosis Date  . Anemia   . History of uterine fibroid   . Pre-diabetes      Past Surgical History:  Procedure Laterality Date  . BREAST LUMPECTOMY Right 10+ years ago   Benign  . CYSTECTOMY  10 years ago   wrist   . LAPAROSCOPIC VAGINAL HYSTERECTOMY  15 years ago   due to fibroids    Social History:  Ambulatory  independently       reports that she has never smoked. She does not have any smokeless tobacco history on file. No history on file for alcohol and drug.   Family History:   Family History  Problem Relation Age of Onset  . Diabetes Mother   . Leukemia Maternal Uncle   . Diabetes Maternal Uncle     Allergies: No Known Allergies   Prior to Admission medications   Medication Sig Start Date End Date Taking? Authorizing Provider  albuterol (PROVENTIL HFA;VENTOLIN HFA) 108 (90 Base) MCG/ACT inhaler Inhale 1-2 puffs into the lungs every 6 (six) hours as needed for wheezing or shortness of breath. 08/06/17   Wallis Bamberg, PA-C  azithromycin (ZITHROMAX) 250 MG tablet Start with 2 tablets today, then 1 daily thereafter. 08/06/17   Wallis Bamberg, PA-C  benzonatate (TESSALON) 100 MG capsule Take 1-2 capsules (100-200 mg total) by mouth 3 (three) times daily as needed for cough. 08/06/17   Wallis Bamberg, PA-C  erythromycin ophthalmic ointment Place into  the right eye 4 (four) times daily. 08/06/17   Jaynee Eagles, PA-C  HYDROcodone-homatropine [facility]) 5-1.5 MG/5ML syrup Take 5 mLs by mouth at bedtime as needed. 08/06/17   Jaynee Eagles, PA-C  IBUPROFEN PO Take 200 mg by mouth as needed.     [provider]  tinidazole (TINDAMAX) 500 MG tablet Take 4 tablets (2,000 mg total) by mouth once. Repeat in 1d 07/23/13   Elveria Rising, MD  venlafaxine XR (EFFEXOR XR) 37.5 MG 24 hr capsule Take 1  capsule (37.5 mg total) by mouth daily with breakfast. May increase to 2 tablets after 2w 07/23/13   Elveria Rising, MD   Physical Exam: Blood pressure (!) 143/63, temperature (!) 101.8 F (38.8 C), temperature source Oral, resp. rate (!) 24, height 5' 4.75" (1.645 m), weight 97.1 kg, last menstrual period 07/24/2003, SpO2 95 %. 1. General:  in No  Acute distress   acutely ill -appearing 2. Psychological: Alert and    Oriented 3. Head/ENT:     Dry Mucous Membranes                          Head Non traumatic, neck supple                            Poor Dentition 4. SKIN:   decreased Skin turgor,  Skin clean Dry and intact no rash 5. Heart: Regular rate and rhythm no  Murmur, no Rub or gallop 6. Lungs:   no wheezes or crackles   7. Abdomen: Soft,  non-tender, Non distended  obese  bowel sounds present 8. Lower extremities: no clubbing, cyanosis, no edema 9. Neurologically Grossly intact, moving all 4 extremities equally   10. MSK: Normal range of motion   All other LABS:     Recent Labs  Lab 06/15/19 2033  WBC 13.0*  NEUTROABS PENDING  HGB 12.8  HCT 38.1  MCV 87.0  PLT 320     Recent Labs  Lab 06/15/19 2033  NA 141  K 3.7  CL 104  CO2 24  GLUCOSE 135*  BUN 10  CREATININE 1.11*  CALCIUM 9.0     Recent Labs  Lab 06/15/19 2033  AST 32  ALT 16  ALKPHOS 62  BILITOT 0.7  PROT 8.0  ALBUMIN 3.0*       Cultures: No results found for: SDES, SPECREQUEST, CULT, REPTSTATUS   Radiological Exams on Admission: DG Chest Port 1 View  Result Date: 06/15/2019 CLINICAL DATA:  Fever and shortness of breath. COVID positive 3 days ago. EXAM: PORTABLE CHEST 1 VIEW COMPARISON:  Radiograph 08/24/2005 FINDINGS: Low lung volumes. Patchy bilateral heterogeneous airspace opacities, mid lower lung zone predominant. Heart is normal in size for technique and inspiration. No large pleural effusion. No pneumothorax. No acute osseous abnormalities are seen. IMPRESSION: Patchy bilateral  heterogeneous airspace opacities, consistent with COVID pneumonia. Electronically Signed   By: Keith Rake M.D.   On: 06/15/2019 21:57    Chart has been reviewed    Assessment/Plan   64 y.o. female with medical history significant of Covid infection positive on 06/12/2019, obesity, asthma Admitted for acute respiratory failure hypoxia secondary to Covid pneumonia  Present on Admission: . Pneumonia due to COVID-19 virus -  FROM HOME  WITH KNOWN HX OF COVID19 ER Novel Corona Virus testing:  Ordered 06/15/19 and is pending    Following concerning LAB/ imaging findings:    ANC/ALC ratio>3.5  BMP: increased BUN/Cr      CRP, LDH: increased  IL-6 and Ferritin increased   Procalcitonin: low   CXR: hazy bilateral peripheral opacities       -Following work-up initiated:      sputum cultures Ordered 06/15/19,     Following complications noted:   evidence of AKI - will provide gentle rehydration   acute respiratory failure with hypoxia - continue oxygen treatment  Plan of treatment: - Transfer to [facility] facility if   bed is available and meets criteria for transfer  Spoke to Dr. Dwana Curd -given severity of illness initiate steroids Decadron 6mg  q 24 hours And pharmacy consult for remdesivir -   IF Hypoxia requiring high nasal flow or rapidly progressing hypoxia   will attempt a trial of Actemra (discussed with patient risks and benefits) no contraindications noted at this time - Will follow daily d.dimer - Assess for ability to prone  - Supportive management -Fluid sparing resuscitation  -Provide oxygen as needed currently on   SpO2: 98 %(@4L  O2 on [place]) O2 Flow Rate (L/min): 4 L/min - IF d.dimer elvated >5 will increase dose of lovenox In emergency department given IV antibiotics given slight elevation white blood cell count will observe wait for results of procalcitonin if significantly elevated would continue otherwise will hold off for now  Poor Prognostic factors  64 y.o.   Personal hx of  obesity , asthma Evidence of  organ damage  Present   AKI, respiratory failure requiring >4L [place]  tachypnea, tachycardia present on admission  ABS neutrophil to lymphocyte ratio >3.5     Will order Airborne and Contact precautions  Family/ patient prognosis discussion: I have discussed case with the patient  who are aware of their prognosis At this point they would like  to be full code      . Acute respiratory disease due to COVID-19 virus -provide oxygen as needed change to high flow if necessary  . AKI (acute kidney injury) (HCC) -mild we will rehydrate  . Dehydration - gentle IV fluids   Other plan as per orders.  DVT prophylaxis:   Lovenox     Code Status:  FULL CODE  as per patient   I had personally discussed CODE STATUS with patient    Family Communication:   Family not at  Bedside    Disposition Plan:    To home once workup is complete and patient is stable                      Consults called: none   Admission status:  ED Disposition    None        inpatient     Expect 2 midnight stay secondary to severity of patient's current illness including   hemodynamic instability despite optimal treatment ( hypoxia )   Severe lab/radiological/exam abnormalities including:    COVID PNA and extensive comorbidities including:   Obesity .    That are currently affecting medical management.   I expect  patient to be hospitalized for 2 midnights requiring inpatient medical care.  Patient is at high risk for adverse outcome (such as loss of life or disability) if not treated.  Indication for inpatient stay as follows:    New or worsening hypoxia  Need for IV antivirals, IV steroids      Level of care    tele    indefinitely please discontinue once patient no longer qualifies   Precautions: admitted as  covid positive Airborne and Contact precautions   PPE: Used by the provider:   P100  eye Goggles,  Gloves  gown     Jagar Lua 06/16/2019, 1:14 AM    [facility]     after 2 AM please page floor coverage PA If 7AM-7PM, please contact the day team taking care of the patient using [email or web address]   Patient was evaluated in the context of the global COVID-19 pandemic, which necessitated consideration that the patient might be at risk for infection with the SARS-CoV-2 virus that causes COVID-19. Institutional protocols and algorithms that pertain to the evaluation of patients at risk for COVID-19 are in a state of rapid change based on information released by regulatory bodies including the CDC and federal and state organizations. These policies and algorithms were followed during the patient's care.

## 2019-06-15 NOTE — ED Provider Notes (Signed)
[facility] EMERGENCY DEPARTMENT Provider Note   CSN: [id number] Arrival date & time: 06/15/19  2015     History No chief complaint on file.   Julie Flowers is a 64 y.o. female.  HPI 64 year old female presented to the ED for shortness of breath, fever, malaise and fatigue over the past 4 days.  Patient tested positive for Covid 3 days ago, states that her symptoms have worsened over the past 3 days.  Called EMS today for shortness of breath, on arrival to the ED patient was hypoxic on room air, placed on 4 L nasal cannula with improvement.  Patient reports overall feeling ill, denies any chest pain, does report some vomiting as well but no diarrhea.  Patient does not wear home oxygen, states that she feels better with the oxygen on currently.  Denies any abdominal pain, leg swelling, headaches or neurological symptoms.    Past Medical History:  Diagnosis Date  . Anemia   . History of uterine fibroid   . Pre-diabetes     Patient Active Problem List   Diagnosis Date Noted  . Pneumonia due to COVID-19 virus 06/15/2019  . Acute respiratory disease due to COVID-19 virus 06/15/2019  . AKI (acute kidney injury) (HCC) 06/15/2019  . Dehydration 06/15/2019    Past Surgical History:  Procedure Laterality Date  . BREAST LUMPECTOMY Right 10+ years ago   Benign  . CYSTECTOMY  10 years ago   wrist   . LAPAROSCOPIC VAGINAL HYSTERECTOMY  15 years ago   due to fibroids     OB History    Gravida  1   Para  1   Term  1   Preterm      AB      Living  1     SAB      TAB      Ectopic      Multiple      Live Births  1           Family History  Problem Relation Age of Onset  . Diabetes Mother   . Leukemia Maternal Uncle   . Diabetes Maternal Uncle     Social History   Tobacco Use  . Smoking status: Never Smoker  Substance Use Topics  . Alcohol use: Not on file  . Drug use: Not on file    Home Medications Prior to Admission  medications   Medication Sig Start Date End Date Taking? Authorizing Provider  albuterol (PROVENTIL HFA;VENTOLIN HFA) 108 (90 Base) MCG/ACT inhaler Inhale 1-2 puffs into the lungs every 6 (six) hours as needed for wheezing or shortness of breath. 08/06/17   Wallis Bamberg, PA-C  azithromycin (ZITHROMAX) 250 MG tablet Start with 2 tablets today, then 1 daily thereafter. 08/06/17   Wallis Bamberg, PA-C  benzonatate (TESSALON) 100 MG capsule Take 1-2 capsules (100-200 mg total) by mouth 3 (three) times daily as needed for cough. 08/06/17   Wallis Bamberg, PA-C  erythromycin ophthalmic ointment Place into the right eye 4 (four) times daily. 08/06/17   Wallis Bamberg, PA-C  HYDROcodone-homatropine [facility]) 5-1.5 MG/5ML syrup Take 5 mLs by mouth at bedtime as needed. 08/06/17   Wallis Bamberg, PA-C  IBUPROFEN PO Take 200 mg by mouth as needed.     [provider]  tinidazole (TINDAMAX) 500 MG tablet Take 4 tablets (2,000 mg total) by mouth once. Repeat in 1d 07/23/13   Douglass Rivers, MD  venlafaxine XR (EFFEXOR XR) 37.5 MG 24 hr capsule Take  1 capsule (37.5 mg total) by mouth daily with breakfast. May increase to 2 tablets after 2w 07/23/13   Douglass Rivers, MD    Allergies    Patient has no known allergies.  Review of Systems   Review of Systems  Constitutional: Positive for chills, fatigue and fever.  HENT: Negative for ear pain and sore throat.   Eyes: Negative for pain and visual disturbance.  Respiratory: Positive for cough and shortness of breath.   Cardiovascular: Negative for chest pain and palpitations.  Gastrointestinal: Positive for nausea. Negative for abdominal pain and vomiting.  Genitourinary: Negative for dysuria and hematuria.  Musculoskeletal: Negative for arthralgias and back pain.  Skin: Negative for color change and rash.  Neurological: Negative for seizures and syncope.  All other systems reviewed and are negative.   Physical Exam Updated Vital Signs BP (!) 153/83 Comment: @4L  O2  on [place]  Pulse 76 Comment: @4L  O2 on [place]  Temp (!) 101.8 F (38.8 C) (Oral)   Resp (!) 28 Comment: @4L  O2 on [place]  Ht 5' 4.75" (1.645 m)   Wt 97.1 kg   LMP 07/24/2003   SpO2 98% Comment: @4L  O2 on [place]  BMI 35.89 kg/m   Physical Exam Vitals and nursing note reviewed.  Constitutional:      General: She is not in acute distress.    Appearance: Normal appearance. She is well-developed. She is ill-appearing.  HENT:     Head: Normocephalic and atraumatic.     Right Ear: External ear normal.     Nose: Nose normal. No congestion.  Eyes:     Conjunctiva/sclera: Conjunctivae normal.  Cardiovascular:     Rate and Rhythm: Regular rhythm. Tachycardia present.     Heart sounds: No murmur.  Pulmonary:     Effort: Pulmonary effort is normal. No respiratory distress.     Breath sounds: Rhonchi present.  Abdominal:     Palpations: Abdomen is soft.     Tenderness: There is no abdominal tenderness. There is no guarding or rebound.     Hernia: No hernia is present.  Musculoskeletal:        General: No swelling, tenderness, deformity or signs of injury. Normal range of motion.     Cervical back: Normal range of motion and neck supple. No rigidity.  Skin:    General: Skin is warm and dry.     Capillary Refill: Capillary refill takes less than 2 seconds.  Neurological:     General: No focal deficit present.     Mental Status: She is alert and oriented to person, place, and time.  Psychiatric:        Mood and Affect: Mood normal.     ED Results / Procedures / Treatments   Labs (all labs ordered are listed, but only abnormal results are displayed) Labs Reviewed  CBC WITH DIFFERENTIAL/PLATELET - Abnormal; Notable for the following components:      Result Value   WBC 13.0 (*)    Neutro Abs 11.3 (*)    Abs Immature Granulocytes 0.25 (*)    All other components within normal limits  COMPREHENSIVE METABOLIC PANEL - Abnormal; Notable for the following components:   Glucose, Bld 135 (*)     Creatinine, Ser 1.11 (*)    Albumin 3.0 (*)    GFR calc non Af Amer 52 (*)    All other components within normal limits  D-DIMER, QUANTITATIVE (NOT AT [facility]) - Abnormal; Notable for the following components:   D-Dimer, Quant 2.34 (*)  All other components within normal limits  LACTATE DEHYDROGENASE - Abnormal; Notable for the following components:   LDH 360 (*)    All other components within normal limits  FIBRINOGEN - Abnormal; Notable for the following components:   Fibrinogen >800 (*)    All other components within normal limits  C-REACTIVE PROTEIN - Abnormal; Notable for the following components:   CRP 25.3 (*)    All other components within normal limits  CULTURE, BLOOD (ROUTINE X 2)  CULTURE, BLOOD (ROUTINE X 2)  LACTIC ACID, PLASMA  PROCALCITONIN  TRIGLYCERIDES  LACTIC ACID, PLASMA  I-STAT ARTERIAL BLOOD GAS, ED  TROPONIN I (HIGH SENSITIVITY)    EKG None  Radiology DG Chest Port 1 View  Result Date: 06/15/2019 CLINICAL DATA:  Fever and shortness of breath. COVID positive 3 days ago. EXAM: PORTABLE CHEST 1 VIEW COMPARISON:  Radiograph 08/24/2005 FINDINGS: Low lung volumes. Patchy bilateral heterogeneous airspace opacities, mid lower lung zone predominant. Heart is normal in size for technique and inspiration. No large pleural effusion. No pneumothorax. No acute osseous abnormalities are seen. IMPRESSION: Patchy bilateral heterogeneous airspace opacities, consistent with COVID pneumonia. Electronically Signed   By: Keith Rake M.D.   On: 06/15/2019 21:57    Procedures Procedures (including critical care time)  Medications Ordered in ED Medications  remdesivir 200 mg in sodium chloride 0.9% 250 mL IVPB (0 mg Intravenous Stopped 06/15/19 2211)    Followed by  remdesivir 100 mg in sodium chloride 0.9 % 100 mL IVPB (has no administration in time range)  azithromycin (ZITHROMAX) 500 mg in sodium chloride 0.9 % 250 mL IVPB (500 mg Intravenous New Bag/Given 06/15/19 2220)   acetaminophen (TYLENOL) tablet 1,000 mg (1,000 mg Oral Given 06/15/19 2043)  lactated ringers bolus 500 mL (0 mLs Intravenous Stopped 06/15/19 2211)  dexamethasone (DECADRON) injection 10 mg (10 mg Intravenous Given 06/15/19 2118)  cefTRIAXone (ROCEPHIN) 1 g in sodium chloride 0.9 % 100 mL IVPB (1 g Intravenous New Bag/Given 06/15/19 2220)    ED Course  I have reviewed the triage vital signs and the nursing notes.  Pertinent labs & imaging results that were available during my care of the patient were reviewed by me and considered in my medical decision making (see chart for details).    MDM Rules/Calculators/A&P                      64 year old female presenting to the ED for shortness of breath, fatigue and hypoxia in the setting of known Covid infection.  On arrival patient was ill-appearing, febrile, tachypneic, tachycardic, otherwise hemodynamically stable.  Patient was satting in the mid 90s on 4 L nasal cannula.  Patient appeared ill, fatigued.  Will obtain basic labs, chest x-ray as well as give Decadron remdesivir.  Patient will need admission for new oxygen requirement as well as continued observation.  Twelve-lead EKG with no signs of acute ischemic changes, doubt ACS or cardiac ischemia.  Patient having no chest pain, mainly shortness of breath and cough, doubt PE at this time.  XR with multifocal PNA. Given abx as well.  Mild leukocytosis as well. Admitted to hospitalist. CMP reassuring. Lactic acid normal. Cardiac monitor with sinus rhythm noted, normal HR and BP.   The attending physician was present and available for all medical decision making and procedures related to this patient's care.    Final Clinical Impression(s) / ED Diagnoses Final diagnoses:  Pneumonia due to COVID-19 virus  Hypoxia    Rx /  DC Orders ED Discharge Orders    None       Jamey Reas, MD 06/15/19 4268    Blane Ohara, MD 06/15/19 2340

## 2019-06-15 NOTE — ED Triage Notes (Addendum)
Came in with [facility],  Reportedly feeling ill for last couple of days with cough and SOB. Patient tested positive for COVID on wedensday, reportedly had temp of 98.5 en route to the hosital currently 101.53F orally, patient A/Ox4, currently on 4L O2 satting in the mid 90's. Lives with daugther.

## 2019-06-15 NOTE — ED Notes (Signed)
Please call Damon Baisch [phone] or Allean Found at [phone] for updates

## 2019-06-16 ENCOUNTER — Encounter (HOSPITAL_COMMUNITY): Payer: Self-pay | Admitting: Internal Medicine

## 2019-06-16 DIAGNOSIS — J069 Acute upper respiratory infection, unspecified: Secondary | ICD-10-CM | POA: Diagnosis not present

## 2019-06-16 DIAGNOSIS — U071 COVID-19: Secondary | ICD-10-CM | POA: Diagnosis not present

## 2019-06-16 DIAGNOSIS — J1282 Pneumonia due to coronavirus disease 2019: Secondary | ICD-10-CM | POA: Diagnosis not present

## 2019-06-16 DIAGNOSIS — R0902 Hypoxemia: Secondary | ICD-10-CM | POA: Diagnosis not present

## 2019-06-16 LAB — COMPREHENSIVE METABOLIC PANEL
ALT: 15 U/L (ref 0–44)
AST: 32 U/L (ref 15–41)
Albumin: 3.1 g/dL — ABNORMAL LOW (ref 3.5–5.0)
Alkaline Phosphatase: 65 U/L (ref 38–126)
Anion gap: 11 (ref 5–15)
BUN: 14 mg/dL (ref 8–23)
CO2: 24 mmol/L (ref 22–32)
Calcium: 8.8 mg/dL — ABNORMAL LOW (ref 8.9–10.3)
Chloride: 107 mmol/L (ref 98–111)
Creatinine, Ser: 0.93 mg/dL (ref 0.44–1.00)
GFR calc Af Amer: >60 mL/min (ref >60)
GFR calc non Af Amer: >60 mL/min (ref >60)
Glucose, Bld: 145 mg/dL — ABNORMAL HIGH (ref 70–99)
Potassium: 4.2 mmol/L (ref 3.5–5.1)
Sodium: 142 mmol/L (ref 135–145)
Total Bilirubin: 0.7 mg/dL (ref 0.3–1.2)
Total Protein: 8.3 g/dL — ABNORMAL HIGH (ref 6.5–8.1)

## 2019-06-16 LAB — C-REACTIVE PROTEIN: CRP: 24 mg/dL — ABNORMAL HIGH (ref <1.0)

## 2019-06-16 LAB — CBC WITH DIFFERENTIAL/PLATELET
Abs Immature Granulocytes: 0.33 10*3/uL — ABNORMAL HIGH (ref 0.00–0.07)
Basophils Absolute: 0.1 10*3/uL (ref 0.0–0.1)
Basophils Relative: 1 %
Eosinophils Absolute: 0 10*3/uL (ref 0.0–0.5)
Eosinophils Relative: 0 %
HCT: 37.4 % (ref 36.0–46.0)
Hemoglobin: 12.6 g/dL (ref 12.0–15.0)
Immature Granulocytes: 3 %
Lymphocytes Relative: 12 %
Lymphs Abs: 1.3 10*3/uL (ref 0.7–4.0)
MCH: 29.1 pg (ref 26.0–34.0)
MCHC: 33.7 g/dL (ref 30.0–36.0)
MCV: 86.4 fL (ref 80.0–100.0)
Monocytes Absolute: 0.1 10*3/uL (ref 0.1–1.0)
Monocytes Relative: 1 %
Neutro Abs: 9 10*3/uL — ABNORMAL HIGH (ref 1.7–7.7)
Neutrophils Relative %: 83 %
Platelets: 324 10*3/uL (ref 150–400)
RBC: 4.33 MIL/uL (ref 3.87–5.11)
RDW: 13.4 % (ref 11.5–15.5)
WBC: 10.8 10*3/uL — ABNORMAL HIGH (ref 4.0–10.5)
nRBC: 0 % (ref 0.0–0.2)

## 2019-06-16 LAB — ABO/RH: ABO/RH(D): O POS

## 2019-06-16 LAB — FERRITIN: Ferritin: 250 ng/mL (ref 11–307)

## 2019-06-16 LAB — HEPATITIS B SURFACE ANTIGEN: Hepatitis B Surface Ag: NONREACTIVE

## 2019-06-16 LAB — MAGNESIUM: Magnesium: 2.3 mg/dL (ref 1.7–2.4)

## 2019-06-16 LAB — TROPONIN I (HIGH SENSITIVITY)
Troponin I (High Sensitivity): 7 ng/L (ref <18)
Troponin I (High Sensitivity): 3 ng/L (ref <18)

## 2019-06-16 LAB — PHOSPHORUS: Phosphorus: 3.6 mg/dL (ref 2.5–4.6)

## 2019-06-16 LAB — HEPATITIS C ANTIBODY: HCV Ab: NONREACTIVE

## 2019-06-16 LAB — SAMPLE TO BLOOD BANK

## 2019-06-16 LAB — D-DIMER, QUANTITATIVE: D-Dimer, Quant: 2.78 ug/mL-FEU — ABNORMAL HIGH (ref 0.00–0.50)

## 2019-06-16 LAB — HIV ANTIBODY (ROUTINE TESTING W REFLEX): HIV Screen 4th Generation wRfx: NONREACTIVE

## 2019-06-16 LAB — TYPE AND SCREEN
ABO/RH(D): O POS
Antibody Screen: NEGATIVE

## 2019-06-16 MED ORDER — METHYLPREDNISOLONE SODIUM SUCC 125 MG IJ SOLR: 50.0000 mg | Freq: Three times a day (TID) | INTRAMUSCULAR | Status: DC

## 2019-06-16 MED ORDER — SODIUM CHLORIDE 0.9% IV SOLUTION: Freq: Once | INTRAVENOUS | Status: AC

## 2019-06-16 MED ORDER — METHYLPREDNISOLONE SODIUM SUCC 125 MG IJ SOLR
50.0000 mg | Freq: Two times a day (BID) | INTRAMUSCULAR | Status: DC
  Administered 2019-06-16 – 2019-06-17 (×3): 50 mg via INTRAVENOUS
  Filled 2019-06-16 (×3): qty 2

## 2019-06-16 MED ORDER — ACETAMINOPHEN 325 MG PO TABS
650.0000 mg | ORAL_TABLET | Freq: Once | ORAL | Status: AC
  Administered 2019-06-16: 650 mg via ORAL
  Filled 2019-06-16: qty 2

## 2019-06-16 MED ORDER — FUROSEMIDE 10 MG/ML IJ SOLN
20.0000 mg | Freq: Once | INTRAMUSCULAR | Status: AC
  Administered 2019-06-16: 15:00:00 20 mg via INTRAVENOUS
  Filled 2019-06-16: qty 2

## 2019-06-16 MED ORDER — DIPHENHYDRAMINE HCL 50 MG/ML IJ SOLN
25.0000 mg | Freq: Once | INTRAMUSCULAR | Status: AC
  Administered 2019-06-16: 25 mg via INTRAVENOUS
  Filled 2019-06-16: qty 1

## 2019-06-16 MED ORDER — ENOXAPARIN SODIUM 60 MG/0.6ML ~~LOC~~ SOLN
50.0000 mg | SUBCUTANEOUS | Status: DC
  Administered 2019-06-16 – 2019-06-18 (×3): 50 mg via SUBCUTANEOUS
  Filled 2019-06-16 (×3): qty 0.6

## 2019-06-16 NOTE — Progress Notes (Signed)
Updated Bonita Quin (sister) and Scherrie Merritts (niece) on current status, condition, and treatment plan.

## 2019-06-16 NOTE — ED Notes (Signed)
Gave report to [facility] to room 125, [place] RN received report.

## 2019-06-16 NOTE — Progress Notes (Signed)
PROGRESS NOTE                                                                                                                                                                                                             Patient Demographics:    Julie Flowers, is a 64 y.o. female, DOB - July 06, 1955, DXI:[id number]  Outpatient Primary MD for the patient is Patient, No Pcp Per   Admit date - 06/15/2019   LOS - 1  No chief complaint on file.      Brief Narrative: Patient is a 64 y.o. female with PMHx of asthma-known Covid positive since 1/20-who presented with shortness of breath-found to have acute hypoxic respiratory failure secondary to COVID-19 pneumonia.   Subjective:    Julie Flowers was on 5 L of oxygen but later was titrated down to 3 L.  She feels weak-but does not have any complaints this morning.  Denies any chest pain.   Assessment  & Plan :   Acute Hypoxic Resp Failure due to Covid 19 Viral pneumonia: On 2-3 L of oxygen-but with significantly elevated CRP.  Will change Decadron to Solu-Medrol-continue remdesivir.  She was just diagnosed with COVID-19 on 1/20-we will go ahead and transfuse 1 unit of Covid convalescent plasma.  She is a poor historian-but claims she has had ?hepatitis B in the past-have ordered hep B surface antigen and hep C antibody-if these are negative and if patient's oxygenation worsens-we can consider Actemra.  Fever: afebrile  O2 requirements:  SpO2: 91 % O2 Flow Rate (L/min): 2 L/min   COVID-19 Labs: Recent Labs    06/15/19 2033  DDIMER 2.34*  LDH 360*  CRP 25.3*    No results found for: BNP  Recent Labs  Lab 06/15/19 2033  PROCALCITON 0.12    Lab Results  Component Value Date   SARSCOV2NAA Detected (A) 06/12/2019     COVID-19 Medications: Steroids: 1/23>> Remdesivir: 1/23>> Convalescent plasma: 1/24>>  Other medications: Diuretics:Euvolemic-no need for  lasix Antibiotics:Not needed as no evidence of bacterial infection  Prone/Incentive Spirometry: encouraged  incentive spirometry use 3-4/hour.  DVT Prophylaxis  :  Lovenox   Bronchial asthma: Not in flare-continue bronchodilators.  Obesity: Estimated body mass index is 35.89 kg/m as calculated from the following:   Height as of this encounter: 5' 4.75" (1.645 m).   Weight as  of this encounter: 97.1 kg.    Consults  :  None  Procedures  :  None  ABG: No results found for: PHART, PCO2ART, PO2ART, HCO3, TCO2, ACIDBASEDEF, O2SAT  Vent Settings: N/A  Condition - Guarded-  Family Communication  : Unable to reach Marlaine Hind at number on facesheet.  Code Status :  Full Code  Diet :  Diet Order            Diet Heart Room service appropriate? Yes; Fluid consistency: Thin  Diet effective now               Disposition Plan  :  Remain hospitalized-probably home with home health services when ready for discharge.  Barriers to discharge: Hypoxia requiring O2 supplementation/complete 5 days of IV Remdesivir  Antimicorbials  :    Anti-infectives (From admission, onward)   Start     Dose/Rate Route Frequency Ordered Stop   06/16/19 2200  remdesivir 100 mg in sodium chloride 0.9 % 100 mL IVPB     100 mg 200 mL/hr over 30 Minutes Intravenous Daily 06/15/19 2048 06/20/19 2159   06/15/19 2200  remdesivir 200 mg in sodium chloride 0.9% 250 mL IVPB     200 mg 580 mL/hr over 30 Minutes Intravenous Once 06/15/19 2048 06/15/19 2211   06/15/19 2200  cefTRIAXone (ROCEPHIN) 1 g in sodium chloride 0.9 % 100 mL IVPB     1 g 200 mL/hr over 30 Minutes Intravenous  Once 06/15/19 2154 06/15/19 2310   06/15/19 2200  azithromycin (ZITHROMAX) 500 mg in sodium chloride 0.9 % 250 mL IVPB     500 mg 250 mL/hr over 60 Minutes Intravenous  Once 06/15/19 2154 06/15/19 2332      Inpatient Medications  Scheduled Meds: . vitamin C  500 mg Oral Daily  . dexamethasone (DECADRON) injection  6 mg  Intravenous Q24H  . enoxaparin (LOVENOX) injection  50 mg Subcutaneous Q24H  . sodium chloride flush  3 mL Intravenous Q12H  . venlafaxine XR  37.5 mg Oral Q breakfast  . zinc sulfate  220 mg Oral Daily   Continuous Infusions: . sodium chloride 75 mL/hr at 06/16/19 0022  . remdesivir 100 mg in NS 100 mL     PRN Meds:.acetaminophen, albuterol, guaiFENesin-dextromethorphan, HYDROcodone-acetaminophen, ondansetron **OR** ondansetron (ZOFRAN) IV   Time Spent in minutes  25  See all Orders from today for further details   Jeoffrey Massed M.D on 06/16/2019 at 7:21 AM  To page go to [email or web address] - use universal password  [facility] -  Office  [phone]    Objective:   Vitals:   06/16/19 0000 06/16/19 0226 06/16/19 0400 06/16/19 0500  BP: (!) 155/82 (!) 141/74 (!) 154/81   Pulse: 78 69 71 67  Resp: (!) 30 (!) 27 (!) 24 (!) 23  Temp:  98.9 F (37.2 C) 99 F (37.2 C)   TempSrc:      SpO2: 97% 93% 92% 91%  Weight:      Height:        Wt Readings from Last 3 Encounters:  06/15/19 97.1 kg  07/23/13 92.5 kg     Intake/Output Summary (Last 24 hours) at 06/16/2019 3496 Last data filed at 06/16/2019 0300 Gross per 24 hour  Intake 1186.18 ml  Output --  Net 1186.18 ml     Physical Exam Gen Exam:Alert awake-not in any distress HEENT:atraumatic, normocephalic Chest: B/L clear to auscultation anteriorly CVS:S1S2 regular Abdomen:soft non tender, non distended Extremities:no edema Neurology: Non focal  Skin: no rash   Data Review:    CBC Recent Labs  Lab 06/15/19 2033  WBC 13.0*  HGB 12.8  HCT 38.1  PLT 320  MCV 87.0  MCH 29.2  MCHC 33.6  RDW 13.5  LYMPHSABS 1.6  MONOABS 0.5  EOSABS 0.0  BASOSABS 0.1    Chemistries  Recent Labs  Lab 06/15/19 2033  NA 141  K 3.7  CL 104  CO2 24  GLUCOSE 135*  BUN 10  CREATININE 1.11*  CALCIUM 9.0  AST 32  ALT 16  ALKPHOS 62  BILITOT 0.7    ------------------------------------------------------------------------------------------------------------------ Recent Labs    06/15/19 2033  TRIG 129    No results found for: HGBA1C ------------------------------------------------------------------------------------------------------------------ No results for input(s): TSH, T4TOTAL, T3FREE, THYROIDAB in the last 72 hours.  Invalid input(s): FREET3 ------------------------------------------------------------------------------------------------------------------ No results for input(s): VITAMINB12, FOLATE, FERRITIN, TIBC, IRON, RETICCTPCT in the last 72 hours.  Coagulation profile No results for input(s): INR, PROTIME in the last 168 hours.  Recent Labs    06/15/19 2033  DDIMER 2.34*    Cardiac Enzymes No results for input(s): CKMB, TROPONINI, MYOGLOBIN in the last 168 hours.  Invalid input(s): CK ------------------------------------------------------------------------------------------------------------------ No results found for: BNP  Micro Results Recent Results (from the past 240 hour(s))  Novel Coronavirus, NAA ([facility])     Status: Abnormal   Collection Time: 06/12/19  1:38 PM   Specimen: Nasopharyngeal(NP) swabs in vial transport medium   NASOPHARYNGE  TESTING  Result Value Ref Range Status   SARS-CoV-2, NAA Detected (A) Not Detected Final    Comment: This nucleic acid amplification test was developed and its performance characteristics determined by [employer]. Nucleic acid amplification tests include RT-PCR and TMA. This test has not been FDA cleared or approved. This test has been authorized by FDA under an Emergency Use Authorization (EUA). This test is only authorized for the duration of time the declaration that circumstances exist justifying the authorization of the emergency use of in vitro diagnostic tests for detection of SARS-CoV-2 virus and/or diagnosis of COVID-19 infection under  section 564(b)(1) of the Act, 21 U.S.C. 258NID-7(O) (1), unless the authorization is terminated or revoked sooner. When diagnostic testing is negative, the possibility of a false negative result should be considered in the context of a patient's recent exposures and the presence of clinical signs and symptoms consistent with COVID-19. An individual without symptoms of COVID-19 and who is not shedding SARS-CoV-2 virus wo uld expect to have a negative (not detected) result in this assay.     Radiology Reports DG Chest Port 1 View  Result Date: 06/15/2019 CLINICAL DATA:  Fever and shortness of breath. COVID positive 3 days ago. EXAM: PORTABLE CHEST 1 VIEW COMPARISON:  Radiograph 08/24/2005 FINDINGS: Low lung volumes. Patchy bilateral heterogeneous airspace opacities, mid lower lung zone predominant. Heart is normal in size for technique and inspiration. No large pleural effusion. No pneumothorax. No acute osseous abnormalities are seen. IMPRESSION: Patchy bilateral heterogeneous airspace opacities, consistent with COVID pneumonia. Electronically Signed   By: Narda Rutherford M.D.   On: 06/15/2019 21:57

## 2019-06-16 NOTE — Plan of Care (Signed)
Patient arrived via EMS from [facility] mid shift, Aox4, initially on 4LNC, able to wean down to 2L, patient lethargic, complains of extreme fatigue, dyspnea on exertion, has had loading dose of remdesivir, afebrile, Continue plan of care Problem: Education: Goal: Knowledge of risk factors and measures for prevention of condition will improve Outcome: Progressing   Problem: Coping: Goal: Psychosocial and spiritual needs will be supported Outcome: Progressing   Problem: Respiratory: Goal: Will maintain a patent airway Outcome: Progressing Goal: Complications related to the disease process, condition or treatment will be avoided or minimized Outcome: Progressing   Problem: Education: Goal: Knowledge of General Education information will improve Description: Including pain rating scale, medication(s)/side effects and non-pharmacologic comfort measures Outcome: Progressing   Problem: Clinical Measurements: Goal: Ability to maintain clinical measurements within normal limits will improve Outcome: Progressing Goal: Will remain free from infection Outcome: Progressing Goal: Diagnostic test results will improve Outcome: Progressing Goal: Respiratory complications will improve Outcome: Progressing Goal: Cardiovascular complication will be avoided Outcome: Progressing   Problem: Nutrition: Goal: Adequate nutrition will be maintained Outcome: Progressing   Problem: Safety: Goal: Ability to remain free from injury will improve Outcome: Progressing   Problem: Activity: Goal: Risk for activity intolerance will decrease Outcome: Not Progressing

## 2019-06-17 LAB — PHOSPHORUS: Phosphorus: 2.2 mg/dL — ABNORMAL LOW (ref 2.5–4.6)

## 2019-06-17 LAB — FERRITIN: Ferritin: 322 ng/mL — ABNORMAL HIGH (ref 11–307)

## 2019-06-17 LAB — COMPREHENSIVE METABOLIC PANEL
ALT: 15 U/L (ref 0–44)
AST: 26 U/L (ref 15–41)
Albumin: 3.2 g/dL — ABNORMAL LOW (ref 3.5–5.0)
Alkaline Phosphatase: 57 U/L (ref 38–126)
Anion gap: 11 (ref 5–15)
BUN: 24 mg/dL — ABNORMAL HIGH (ref 8–23)
CO2: 26 mmol/L (ref 22–32)
Calcium: 9.4 mg/dL (ref 8.9–10.3)
Chloride: 108 mmol/L (ref 98–111)
Creatinine, Ser: 1.07 mg/dL — ABNORMAL HIGH (ref 0.44–1.00)
GFR calc Af Amer: >60 mL/min (ref >60)
GFR calc non Af Amer: 54 mL/min — ABNORMAL LOW (ref >60)
Glucose, Bld: 158 mg/dL — ABNORMAL HIGH (ref 70–99)
Potassium: 3.6 mmol/L (ref 3.5–5.1)
Sodium: 145 mmol/L (ref 135–145)
Total Bilirubin: 0.7 mg/dL (ref 0.3–1.2)
Total Protein: 8.2 g/dL — ABNORMAL HIGH (ref 6.5–8.1)

## 2019-06-17 LAB — PREPARE FRESH FROZEN PLASMA: Unit division: 0

## 2019-06-17 LAB — CBC WITH DIFFERENTIAL/PLATELET
Abs Immature Granulocytes: 0.34 10*3/uL — ABNORMAL HIGH (ref 0.00–0.07)
Basophils Absolute: 0.1 10*3/uL (ref 0.0–0.1)
Basophils Relative: 1 %
Eosinophils Absolute: 0 10*3/uL (ref 0.0–0.5)
Eosinophils Relative: 0 %
HCT: 35.4 % — ABNORMAL LOW (ref 36.0–46.0)
Hemoglobin: 11.6 g/dL — ABNORMAL LOW (ref 12.0–15.0)
Immature Granulocytes: 3 %
Lymphocytes Relative: 14 %
Lymphs Abs: 1.8 10*3/uL (ref 0.7–4.0)
MCH: 28.2 pg (ref 26.0–34.0)
MCHC: 32.8 g/dL (ref 30.0–36.0)
MCV: 85.9 fL (ref 80.0–100.0)
Monocytes Absolute: 0.5 10*3/uL (ref 0.1–1.0)
Monocytes Relative: 4 %
Neutro Abs: 9.6 10*3/uL — ABNORMAL HIGH (ref 1.7–7.7)
Neutrophils Relative %: 78 %
Platelets: 352 10*3/uL (ref 150–400)
RBC: 4.12 MIL/uL (ref 3.87–5.11)
RDW: 13.2 % (ref 11.5–15.5)
WBC: 12.3 10*3/uL — ABNORMAL HIGH (ref 4.0–10.5)
nRBC: 0 % (ref 0.0–0.2)

## 2019-06-17 LAB — C-REACTIVE PROTEIN: CRP: 15.5 mg/dL — ABNORMAL HIGH (ref <1.0)

## 2019-06-17 LAB — BPAM FFP
Blood Product Expiration Date: 202101251043
ISSUE DATE / TIME: 202101241400
Unit Type and Rh: 5100

## 2019-06-17 LAB — MAGNESIUM: Magnesium: 2.4 mg/dL (ref 1.7–2.4)

## 2019-06-17 LAB — D-DIMER, QUANTITATIVE: D-Dimer, Quant: 2.06 ug/mL-FEU — ABNORMAL HIGH (ref 0.00–0.50)

## 2019-06-17 MED ORDER — PREDNISONE 10 MG PO TABS: ORAL_TABLET | ORAL | 0 refills | Status: DC

## 2019-06-17 MED ORDER — FUROSEMIDE 10 MG/ML IJ SOLN
20.0000 mg | Freq: Once | INTRAMUSCULAR | Status: AC
  Administered 2019-06-17: 20 mg via INTRAVENOUS
  Filled 2019-06-17: qty 2

## 2019-06-17 MED ORDER — BENZONATATE 100 MG PO CAPS: 100.0000 mg | ORAL_CAPSULE | Freq: Three times a day (TID) | ORAL | 0 refills | Status: DC | PRN

## 2019-06-17 MED ORDER — METHYLPREDNISOLONE SODIUM SUCC 40 MG IJ SOLR
40.0000 mg | Freq: Two times a day (BID) | INTRAMUSCULAR | Status: DC
  Administered 2019-06-17 – 2019-06-18 (×2): 40 mg via INTRAVENOUS
  Filled 2019-06-17 (×2): qty 1

## 2019-06-17 MED ORDER — SODIUM CHLORIDE 0.9 % IV SOLN
100.0000 mg | Freq: Every day | INTRAVENOUS | Status: AC
  Administered 2019-06-17 – 2019-06-18 (×3): 100 mg via INTRAVENOUS
  Filled 2019-06-17 (×2): qty 20

## 2019-06-17 MED ORDER — ALBUTEROL SULFATE HFA 108 (90 BASE) MCG/ACT IN AERS
2.0000 | INHALATION_SPRAY | Freq: Four times a day (QID) | RESPIRATORY_TRACT | Status: DC
  Administered 2019-06-17 – 2019-06-18 (×4): 2 via RESPIRATORY_TRACT
  Filled 2019-06-17: qty 6.7

## 2019-06-17 NOTE — Progress Notes (Signed)
Holding discharge today-patient appears to be weak per RN-await PT eval.  Continue to ambulate as much as possible.  Hopefully home on 1/26.

## 2019-06-17 NOTE — Discharge Summary (Addendum)
PATIENT DETAILS Name: Julie Flowers Age: 64 y.o. Sex: female Date of Birth: Feb 10, 1956 MRN: [id number]. Admitting Physician: Therisa Doyne, MD ENI:[id number], No Pcp Per  Admit Date: 06/15/2019 Discharge date: 06/18/2019  Recommendations for Outpatient Follow-up:  1. Follow up with PCP in 1-2 weeks 2. Please obtain CMP/CBC in one week 3. Repeat Chest Xray in 4-6 week   Admitted From:  Home  Disposition: Home   Home Health: No  Equipment/Devices: None  Discharge Condition: Stable  CODE STATUS: FULL CODE  Diet recommendation:  Diet Order            Diet - low sodium heart healthy        Diet Heart Room service appropriate? Yes; Fluid consistency: Thin  Diet effective now               Brief Summary: Patient is a 64 y.o. female with PMHx of asthma-known Covid positive since 1/20-who presented with shortness of breath-found to have acute hypoxic respiratory failure secondary to COVID-19 pneumonia.   Brief Hospital Course: Acute Hypoxic Resp Failure due to Covid 19 Viral pneumonia:  Was on 2-3 L of oxygen on admission-but has rapidly improved-she is now on room air.  CRP is trending down.  She feels significantly better and is wanting to go home.  She was treated with steroids and remdesivir.  Since she has rapidly improved and is on room air-suspect she can be discharged home today with tapering steroids, she will continue with remdesivir at the infusion center as an outpatient.  She also received convalescent plasma on 1/23   COVID-19 Labs:  Recent Labs    06/15/19 2033 06/15/19 2033 06/16/19 0650 06/17/19 0308 06/18/19 0227  DDIMER 2.34*   < > 2.78* 2.06* 2.29*  FERRITIN  --   --  250 322* 310*  LDH 360*  --   --   --   --   CRP 25.3*   < > 24.0* 15.5* 7.0*   < > = values in this interval not displayed.    Lab Results  Component Value Date   SARSCOV2NAA Detected (A) 06/12/2019     COVID-19 Medications: Steroids: 1/23>> Remdesivir:  1/23>>1/27 Convalescent plasma: 1/24>>  Bronchial asthma: Not in flare-continue bronchodilators.  HTN: Blood pressure elevated-started on amlodipine-further optimization deferred to primary care practitioner.  Obesity: Estimated body mass index is 35.89 kg/m as calculated from the following:   Height as of this encounter: 5' 4.75" (1.645 m).   Weight as of this encounter: 97.1 kg.   Procedures/Studies: None  Discharge Diagnoses:  Active Problems:   Pneumonia due to COVID-19 virus   Acute respiratory disease due to COVID-19 virus   AKI (acute kidney injury) [facility])   Dehydration   Discharge Instructions:    Person Under Monitoring Name: Julie Flowers  Location: [address] [place] [address]   Infection Prevention Recommendations for Individuals Confirmed to have, or Being Evaluated for, 2019 Novel Coronavirus (COVID-19) Infection Who Receive Care at Home  Individuals who are confirmed to have, or are being evaluated for, COVID-19 should follow the prevention steps below until a healthcare provider or local or state health department says they can return to normal activities.  Stay home except to get medical care You should restrict activities outside your home, except for getting medical care. Do not go to work, school, or public areas, and do not use public transportation or taxis.  Call ahead before visiting your doctor Before your medical appointment, call the healthcare provider  and tell them that you have, or are being evaluated for, COVID-19 infection. This will help the healthcare provider's office take steps to keep other people from getting infected. Ask your healthcare provider to call the local or state health department.  Monitor your symptoms Seek prompt medical attention if your illness is worsening (e.g., difficulty breathing). Before going to your medical appointment, call the healthcare provider and tell them that you have, or are being  evaluated for, COVID-19 infection. Ask your healthcare provider to call the local or state health department.  Wear a facemask You should wear a facemask that covers your nose and mouth when you are in the same room with other people and when you visit a healthcare provider. People who live with or visit you should also wear a facemask while they are in the same room with you.  Separate yourself from other people in your home As much as possible, you should stay in a different room from other people in your home. Also, you should use a separate bathroom, if available.  Avoid sharing household items You should not share dishes, drinking glasses, cups, eating utensils, towels, bedding, or other items with other people in your home. After using these items, you should wash them thoroughly with soap and water.  Cover your coughs and sneezes Cover your mouth and nose with a tissue when you cough or sneeze, or you can cough or sneeze into your sleeve. Throw used tissues in a lined trash can, and immediately wash your hands with soap and water for at least 20 seconds or use an alcohol-based hand rub.  Wash your [employer] your hands often and thoroughly with soap and water for at least 20 seconds. You can use an alcohol-based hand sanitizer if soap and water are not available and if your hands are not visibly dirty. Avoid touching your eyes, nose, and mouth with unwashed hands.   Prevention Steps for Caregivers and Household Members of Individuals Confirmed to have, or Being Evaluated for, COVID-19 Infection Being Cared for in the Home  If you live with, or provide care at home for, a person confirmed to have, or being evaluated for, COVID-19 infection please follow these guidelines to prevent infection:  Follow healthcare provider's instructions Make sure that you understand and can help the patient follow any healthcare provider instructions for all care.  Provide for the patient's  basic needs You should help the patient with basic needs in the home and provide support for getting groceries, prescriptions, and other personal needs.  Monitor the patient's symptoms If they are getting sicker, call his or her medical provider and tell them that the patient has, or is being evaluated for, COVID-19 infection. This will help the healthcare provider's office take steps to keep other people from getting infected. Ask the healthcare provider to call the local or state health department.  Limit the number of people who have contact with the patient  If possible, have only one caregiver for the patient.  Other household members should stay in another home or place of residence. If this is not possible, they should stay  in another room, or be separated from the patient as much as possible. Use a separate bathroom, if available.  Restrict visitors who do not have an essential need to be in the home.  Keep older adults, very young children, and other sick people away from the patient Keep older adults, very young children, and those who have compromised immune systems or  chronic health conditions away from the patient. This includes people with chronic heart, lung, or kidney conditions, diabetes, and cancer.  Ensure good ventilation Make sure that shared spaces in the home have good air flow, such as from an air conditioner or an opened window, weather permitting.  Wash your hands often  Wash your hands often and thoroughly with soap and water for at least 20 seconds. You can use an alcohol based hand sanitizer if soap and water are not available and if your hands are not visibly dirty.  Avoid touching your eyes, nose, and mouth with unwashed hands.  Use disposable paper towels to dry your hands. If not available, use dedicated cloth towels and replace them when they become wet.  Wear a facemask and gloves  Wear a disposable facemask at all times in the room and gloves  when you touch or have contact with the patient's blood, body fluids, and/or secretions or excretions, such as sweat, saliva, sputum, nasal mucus, vomit, urine, or feces.  Ensure the mask fits over your nose and mouth tightly, and do not touch it during use.  Throw out disposable facemasks and gloves after using them. Do not reuse.  Wash your hands immediately after removing your facemask and gloves.  If your personal clothing becomes contaminated, carefully remove clothing and launder. Wash your hands after handling contaminated clothing.  Place all used disposable facemasks, gloves, and other waste in a lined container before disposing them with other household waste.  Remove gloves and wash your hands immediately after handling these items.  Do not share dishes, glasses, or other household items with the patient  Avoid sharing household items. You should not share dishes, drinking glasses, cups, eating utensils, towels, bedding, or other items with a patient who is confirmed to have, or being evaluated for, COVID-19 infection.  After the person uses these items, you should wash them thoroughly with soap and water.  Wash laundry thoroughly  Immediately remove and wash clothes or bedding that have blood, body fluids, and/or secretions or excretions, such as sweat, saliva, sputum, nasal mucus, vomit, urine, or feces, on them.  Wear gloves when handling laundry from the patient.  Read and follow directions on labels of laundry or clothing items and detergent. In general, wash and dry with the warmest temperatures recommended on the label.  Clean all areas the individual has used often  Clean all touchable surfaces, such as counters, tabletops, doorknobs, bathroom fixtures, toilets, phones, keyboards, tablets, and bedside tables, every day. Also, clean any surfaces that may have blood, body fluids, and/or secretions or excretions on them.  Wear gloves when cleaning surfaces the patient  has come in contact with.  Use a diluted bleach solution (e.g., dilute bleach with 1 part bleach and 10 parts water) or a household disinfectant with a label that says EPA-registered for coronaviruses. To make a bleach solution at home, add 1 tablespoon of bleach to 1 quart (4 cups) of water. For a larger supply, add  cup of bleach to 1 gallon (16 cups) of water.  Read labels of cleaning products and follow recommendations provided on product labels. Labels contain instructions for safe and effective use of the cleaning product including precautions you should take when applying the product, such as wearing gloves or eye protection and making sure you have good ventilation during use of the product.  Remove gloves and wash hands immediately after cleaning.  Monitor yourself for signs and symptoms of illness Caregivers and household members are  considered close contacts, should monitor their health, and will be asked to limit movement outside of the home to the extent possible. Follow the monitoring steps for close contacts listed on the symptom monitoring form.   ? If you have additional questions, contact your local health department or call the epidemiologist on call at [phone] (available 24/7). ? This guidance is subject to change. For the most up-to-date guidance from CDC, please refer to their website: [employer]Met[employer]hu    Activity:  As tolerated   Discharge Instructions    Call MD for:  difficulty breathing, headache or visual disturbances   Complete by: As directed    Call MD for:  extreme fatigue   Complete by: As directed    Call MD for:  persistant nausea and vomiting   Complete by: As directed    Diet - low sodium heart healthy   Complete by: As directed    Discharge instructions   Complete by: As directed    1.) 3 weeks of isolation from 06/12/2019  2.) You are scheduled for an outpatient infusion of  Remdesivir at 530PM on Wednesday 1/27 .  Please report to [facility] at [address]  Drive to the security guard and tell them you are here for an infusion. They will direct you to the front entrance where we will come and get you.  For questions call [phone].   Follow with Primary MD  Patient, No Pcp Per in 1-2 weeks  Please get a complete blood count and chemistry panel checked by your Primary MD at your next visit, and again as instructed by your Primary MD.  Get Medicines reviewed and adjusted: Please take all your medications with you for your next visit with your Primary MD  Laboratory/radiological data: Please request your Primary MD to go over all hospital tests and procedure/radiological results at the follow up, please ask your Primary MD to get all Hospital records sent to his/her office.  In some cases, they will be blood work, cultures and biopsy results pending at the time of your discharge. Please request that your primary care M.D. follows up on these results.  Also Note the following: If you experience worsening of your admission symptoms, develop shortness of breath, life threatening emergency, suicidal or homicidal thoughts you must seek medical attention immediately by calling 911 or calling your MD immediately  if symptoms less severe.  You must read complete instructions/literature along with all the possible adverse reactions/side effects for all the Medicines you take and that have been prescribed to you. Take any new Medicines after you have completely understood and accpet all the possible adverse reactions/side effects.   Do not drive when taking Pain medications or sleeping medications (Benzodaizepines)  Do not take more than prescribed Pain, Sleep and Anxiety Medications. It is not advisable to combine anxiety,sleep and pain medications without talking with your primary care practitioner  Special Instructions: If you have smoked or chewed  Tobacco  in the last 2 yrs please stop smoking, stop any regular Alcohol  and or any Recreational drug use.  Wear Seat belts while driving.  Please note: You were cared for by a hospitalist during your hospital stay. Once you are discharged, your primary care physician will handle any further medical issues. Please note that NO REFILLS for any discharge medications will be authorized once you are discharged, as it is imperative that you return to your primary care physician (or establish a relationship with a primary care  physician if you do not have one) for your post hospital discharge needs so that they can reassess your need for medications and monitor your lab values.   Increase activity slowly   Complete by: As directed      Allergies as of 06/18/2019   No Known Allergies     Medication List    TAKE these medications   amLODipine 5 MG tablet Commonly known as: NORVASC Take 1 tablet (5 mg total) by mouth daily.   benzonatate 100 MG capsule Commonly known as: Tessalon Perles Take 1 capsule (100 mg total) by mouth 3 (three) times daily as needed for cough.   predniSONE 10 MG tablet Commonly known as: DELTASONE Take 40 mg daily for 1 day, 30 mg daily for 1 day, 20 mg daily for 1 days,10 mg daily for 1 day, then stop            Durable Medical Equipment  (From admission, onward)         Start     Ordered   06/18/19 0709  For home use only DME Walker rolling  Once    Question Answer Comment  Walker: With 5 Inch Wheels   Patient needs a walker to treat with the following condition Physical deconditioning      06/18/19 0708         Follow-up Information    Primary care MD. Schedule an appointment as soon as possible for a visit in 1 week(s).          No Known Allergies  Consultations:   None   Other Procedures/Studies: DG Chest Port 1 View  Result Date: 06/15/2019 CLINICAL DATA:  Fever and shortness of breath. COVID positive 3 days ago. EXAM: PORTABLE  CHEST 1 VIEW COMPARISON:  Radiograph 08/24/2005 FINDINGS: Low lung volumes. Patchy bilateral heterogeneous airspace opacities, mid lower lung zone predominant. Heart is normal in size for technique and inspiration. No large pleural effusion. No pneumothorax. No acute osseous abnormalities are seen. IMPRESSION: Patchy bilateral heterogeneous airspace opacities, consistent with COVID pneumonia. Electronically Signed   By: Narda RutherfordMelanie  Sanford M.D.   On: 06/15/2019 21:57     TODAY-DAY OF DISCHARGE:  Subjective:   Julie Flowers today has no headache,no chest abdominal pain,no new weakness tingling or numbness, feels much better wants to go home today.   Objective:   Blood pressure (!) 178/82, pulse 86, temperature 98.1 F (36.7 C), temperature source Oral, resp. rate 18, height 5' 4.75" (1.645 m), weight 97.1 kg, last menstrual period 07/24/2003, SpO2 93 %.  Intake/Output Summary (Last 24 hours) at 06/18/2019 1112 Last data filed at 06/18/2019 0200 Gross per 24 hour  Intake 820 ml  Output --  Net 820 ml   Filed Weights   06/15/19 2025  Weight: 97.1 kg    Exam: Awake Alert, Oriented *3, No new F.N deficits, Normal affect [place].AT,PERRAL Supple Neck,No JVD, No cervical lymphadenopathy appriciated.  Symmetrical Chest wall movement, Good air movement bilaterally, CTAB RRR,No Gallops,Rubs or new Murmurs, No Parasternal Heave +ve B.Sounds, Abd Soft, Non tender, No organomegaly appriciated, No rebound -guarding or rigidity. No Cyanosis, Clubbing or edema, No new Rash or bruise   PERTINENT RADIOLOGIC STUDIES: DG Chest Port 1 View  Result Date: 06/15/2019 CLINICAL DATA:  Fever and shortness of breath. COVID positive 3 days ago. EXAM: PORTABLE CHEST 1 VIEW COMPARISON:  Radiograph 08/24/2005 FINDINGS: Low lung volumes. Patchy bilateral heterogeneous airspace opacities, mid lower lung zone predominant. Heart is normal in size for technique and  inspiration. No large pleural effusion. No pneumothorax.  No acute osseous abnormalities are seen. IMPRESSION: Patchy bilateral heterogeneous airspace opacities, consistent with COVID pneumonia. Electronically Signed   By: Keith Rake M.D.   On: 06/15/2019 21:57     PERTINENT LAB RESULTS: CBC: Recent Labs    06/17/19 0308 06/18/19 0227  WBC 12.3* 15.4*  HGB 11.6* 12.6  HCT 35.4* 37.5  PLT 352 401*   CMET CMP     Component Value Date/Time   NA 145 06/18/2019 0227   K 3.5 06/18/2019 0227   CL 108 06/18/2019 0227   CO2 26 06/18/2019 0227   GLUCOSE 152 (H) 06/18/2019 0227   BUN 28 (H) 06/18/2019 0227   CREATININE 0.99 06/18/2019 0227   CALCIUM 9.4 06/18/2019 0227   PROT 8.2 (H) 06/18/2019 0227   ALBUMIN 3.2 (L) 06/18/2019 0227   AST 26 06/18/2019 0227   ALT 15 06/18/2019 0227   ALKPHOS 57 06/18/2019 0227   BILITOT 0.7 06/18/2019 0227   GFRNONAA 60 (L) 06/18/2019 0227   GFRAA >60 06/18/2019 0227    GFR Estimated Creatinine Clearance: 65 mL/min (by C-G formula based on SCr of 0.99 mg/dL). No results for input(s): LIPASE, AMYLASE in the last 72 hours. No results for input(s): CKTOTAL, CKMB, CKMBINDEX, TROPONINI in the last 72 hours. Invalid input(s): POCBNP Recent Labs    06/17/19 0308 06/18/19 0227  DDIMER 2.06* 2.29*   No results for input(s): HGBA1C in the last 72 hours. Recent Labs    06/15/19 2033  TRIG 129   No results for input(s): TSH, T4TOTAL, T3FREE, THYROIDAB in the last 72 hours.  Invalid input(s): FREET3 Recent Labs    06/17/19 0308 06/18/19 0227  FERRITIN 322* 310*   Coags: No results for input(s): INR in the last 72 hours.  Invalid input(s): PT Microbiology: Recent Results (from the past 240 hour(s))  Novel Coronavirus, NAA ([facility])     Status: Abnormal   Collection Time: 06/12/19  1:38 PM   Specimen: Nasopharyngeal(NP) swabs in vial transport medium   NASOPHARYNGE  TESTING  Result Value Ref Range Status   SARS-CoV-2, NAA Detected (A) Not Detected Final    Comment: This nucleic acid  amplification test was developed and its performance characteristics determined by [employer]. Nucleic acid amplification tests include RT-PCR and TMA. This test has not been FDA cleared or approved. This test has been authorized by FDA under an Emergency Use Authorization (EUA). This test is only authorized for the duration of time the declaration that circumstances exist justifying the authorization of the emergency use of in vitro diagnostic tests for detection of SARS-CoV-2 virus and/or diagnosis of COVID-19 infection under section 564(b)(1) of the Act, 21 U.S.C. 546EVO-3(J) (1), unless the authorization is terminated or revoked sooner. When diagnostic testing is negative, the possibility of a false negative result should be considered in the context of a patient's recent exposures and the presence of clinical signs and symptoms consistent with COVID-19. An individual without symptoms of COVID-19 and who is not shedding SARS-CoV-2 virus wo uld expect to have a negative (not detected) result in this assay.   Blood Culture (routine x 2)     Status: None (Preliminary result)   Collection Time: 06/15/19  8:57 PM   Specimen: BLOOD  Result Value Ref Range Status   Specimen Description BLOOD RIGHT ANTECUBITAL  Final   Special Requests   Final    BOTTLES DRAWN AEROBIC AND ANAEROBIC Blood Culture adequate volume   Culture   Final  NO GROWTH 3 DAYS Performed at [facility], [address], [place], [place] [address]    Report Status PENDING  Incomplete  Blood Culture (routine x 2)     Status: None (Preliminary result)   Collection Time: 06/15/19  9:04 PM   Specimen: BLOOD  Result Value Ref Range Status   Specimen Description BLOOD LEFT ANTECUBITAL  Final   Special Requests   Final    BOTTLES DRAWN AEROBIC AND ANAEROBIC Blood Culture results may not be optimal due to an inadequate volume of blood received in culture bottles   Culture   Final    NO GROWTH 3  DAYS Performed at [facility], [address] [place], [place] [address]    Report Status PENDING  Incomplete    FURTHER DISCHARGE INSTRUCTIONS:  Get Medicines reviewed and adjusted: Please take all your medications with you for your next visit with your Primary MD  Laboratory/radiological data: Please request your Primary MD to go over all hospital tests and procedure/radiological results at the follow up, please ask your Primary MD to get all Hospital records sent to his/her office.  In some cases, they will be blood work, cultures and biopsy results pending at the time of your discharge. Please request that your primary care M.D. goes through all the records of your hospital data and follows up on these results.  Also Note the following: If you experience worsening of your admission symptoms, develop shortness of breath, life threatening emergency, suicidal or homicidal thoughts you must seek medical attention immediately by calling 911 or calling your MD immediately  if symptoms less severe.  You must read complete instructions/literature along with all the possible adverse reactions/side effects for all the Medicines you take and that have been prescribed to you. Take any new Medicines after you have completely understood and accpet all the possible adverse reactions/side effects.   Do not drive when taking Pain medications or sleeping medications (Benzodaizepines)  Do not take more than prescribed Pain, Sleep and Anxiety Medications. It is not advisable to combine anxiety,sleep and pain medications without talking with your primary care practitioner  Special Instructions: If you have smoked or chewed Tobacco  in the last 2 yrs please stop smoking, stop any regular Alcohol  and or any Recreational drug use.  Wear Seat belts while driving.  Please note: You were cared for by a hospitalist during your hospital stay. Once you are discharged, your primary care physician will  handle any further medical issues. Please note that NO REFILLS for any discharge medications will be authorized once you are discharged, as it is imperative that you return to your primary care physician (or establish a relationship with a primary care physician if you do not have one) for your post hospital discharge needs so that they can reassess your need for medications and monitor your lab values.  Total Time spent coordinating discharge including counseling, education and face to face time equals 35 minutes.  SignedJeoffrey Massed 06/18/2019 11:12 AM

## 2019-06-17 NOTE — Progress Notes (Signed)
Patient scheduled for outpatient Remdesivir infusion at 5:30 on Wednesday 1/27.  Please advise them to report to [facility] at [address].  Drive to the security guard and tell them you are here for an infusion. They will direct you to the front entrance where we will come and get you.  For questions call [phone].  Thanks

## 2019-06-17 NOTE — Progress Notes (Signed)
PROGRESS NOTE                                                                                                                                                                                                             Patient Demographics:    Julie Flowers, is a 64 y.o. female, DOB - 05-22-1956, QQP:[id number]  Outpatient Primary MD for the patient is Patient, No Pcp Per   Admit date - 06/15/2019   LOS - 2  No chief complaint on file.      Brief Narrative: Patient is a 64 y.o. female with PMHx of asthma-known Covid positive since 1/20-who presented with shortness of breath-found to have acute hypoxic respiratory failure secondary to COVID-19 pneumonia.   Subjective:   Improved-on room air now-plans are to discharge today -was not ambulated so far-upon ambulation she started getting short of breath.  Plans are to keep for 1 more day.   Assessment  & Plan :   Acute Hypoxic Resp Failure due to Covid 19 Viral pneumonia: Markedly better-continue steroids and remdesivir.  CRP downtrending.  No need for Actemra given remarkable clinical improvement.    Fever: afebrile  O2 requirements:  SpO2: 97 % O2 Flow Rate (L/min): 1 L/min   COVID-19 Labs: Recent Labs    06/15/19 2033 06/16/19 0650 06/17/19 0308  DDIMER 2.34* 2.78* 2.06*  FERRITIN  --  250 322*  LDH 360*  --   --   CRP 25.3* 24.0* 15.5*    No results found for: BNP  Recent Labs  Lab 06/15/19 2033  PROCALCITON 0.12    Lab Results  Component Value Date   SARSCOV2NAA Detected (A) 06/12/2019     COVID-19 Medications: Steroids: 1/23>> Remdesivir: 1/23>> Convalescent plasma: 1/24>>  Other medications: Diuretics:Euvolemic-no need for lasix Antibiotics:Not needed as no evidence of bacterial infection  Prone/Incentive Spirometry: encouraged  incentive spirometry use 3-4/hour.  DVT Prophylaxis  :  Lovenox   Bronchial asthma: Not in flare-continue  bronchodilators.  Obesity: Estimated body mass index is 35.89 kg/m as calculated from the following:   Height as of this encounter: 5' 4.75" (1.645 m).   Weight as of this encounter: 97.1 kg.    Consults  :  None  Procedures  :  None  ABG: No results found for: PHART, PCO2ART, PO2ART, HCO3, TCO2, ACIDBASEDEF, O2SAT  Vent Settings:  N/A  Condition - Guarded-  Family Communication  : Spoke with sister over the phone  Code Status :  Full Code  Diet :  Diet Order            Diet - low sodium heart healthy        Diet Heart Room service appropriate? Yes; Fluid consistency: Thin  Diet effective now               Disposition Plan  :  Remain hospitalized-home on 1/26.  Barriers to discharge: Hypoxia requiring O2 supplementation/complete 5 days of IV Remdesivir  Antimicorbials  :    Anti-infectives (From admission, onward)   Start     Dose/Rate Route Frequency Ordered Stop   06/17/19 1100  remdesivir 100 mg in sodium chloride 0.9 % 100 mL IVPB     100 mg 200 mL/hr over 30 Minutes Intravenous Daily 06/17/19 1043 06/19/19 2159   06/16/19 2200  remdesivir 100 mg in sodium chloride 0.9 % 100 mL IVPB  Status:  Discontinued     100 mg 200 mL/hr over 30 Minutes Intravenous Daily 06/15/19 2048 06/17/19 1043   06/15/19 2200  remdesivir 200 mg in sodium chloride 0.9% 250 mL IVPB     200 mg 580 mL/hr over 30 Minutes Intravenous Once 06/15/19 2048 06/15/19 2211   06/15/19 2200  cefTRIAXone (ROCEPHIN) 1 g in sodium chloride 0.9 % 100 mL IVPB     1 g 200 mL/hr over 30 Minutes Intravenous  Once 06/15/19 2154 06/15/19 2310   06/15/19 2200  azithromycin (ZITHROMAX) 500 mg in sodium chloride 0.9 % 250 mL IVPB     500 mg 250 mL/hr over 60 Minutes Intravenous  Once 06/15/19 2154 06/15/19 2332      Inpatient Medications  Scheduled Meds: . vitamin C  500 mg Oral Daily  . enoxaparin (LOVENOX) injection  50 mg Subcutaneous Q24H  . methylPREDNISolone (SOLU-MEDROL) injection  50 mg  Intravenous Q12H  . sodium chloride flush  3 mL Intravenous Q12H  . venlafaxine XR  37.5 mg Oral Q breakfast  . zinc sulfate  220 mg Oral Daily   Continuous Infusions: . remdesivir 100 mg in NS 100 mL 100 mg (06/17/19 1157)   PRN Meds:.acetaminophen, albuterol, guaiFENesin-dextromethorphan, HYDROcodone-acetaminophen, ondansetron **OR** ondansetron (ZOFRAN) IV   Time Spent in minutes  25  See all Orders from today for further details   Jeoffrey Massed M.D on 06/17/2019 at 2:30 PM  To page go to [email or web address] - use universal password  [facility] -  Office  [phone]    Objective:   Vitals:   06/16/19 1515 06/16/19 2140 06/17/19 0418 06/17/19 0738  BP: (!) 165/86 (!) 154/78 (!) 160/82 (!) 150/80  Pulse: 77 68 70 65  Resp: 20 17 16 20   Temp: 97.8 F (36.6 C) 98 F (36.7 C) 97.9 F (36.6 C) 98.1 F (36.7 C)  TempSrc: Oral Oral Oral Oral  SpO2:  100% 96% 97%  Weight:      Height:        Wt Readings from Last 3 Encounters:  06/15/19 97.1 kg  07/23/13 92.5 kg     Intake/Output Summary (Last 24 hours) at 06/17/2019 1430 Last data filed at 06/17/2019 0800 Gross per 24 hour  Intake 1050.93 ml  Output 500 ml  Net 550.93 ml     Physical Exam Gen Exam:Alert awake-not in any distress HEENT:atraumatic, normocephalic Chest: B/L clear to auscultation anteriorly CVS:S1S2 regular Abdomen:soft non tender, non distended Extremities:no edema Neurology:  Non focal Skin: no rash   Data Review:    CBC Recent Labs  Lab 06/15/19 2033 06/16/19 0650 06/17/19 0308  WBC 13.0* 10.8* 12.3*  HGB 12.8 12.6 11.6*  HCT 38.1 37.4 35.4*  PLT 320 324 352  MCV 87.0 86.4 85.9  MCH 29.2 29.1 28.2  MCHC 33.6 33.7 32.8  RDW 13.5 13.4 13.2  LYMPHSABS 1.6 1.3 1.8  MONOABS 0.5 0.1 0.5  EOSABS 0.0 0.0 0.0  BASOSABS 0.1 0.1 0.1    Chemistries  Recent Labs  Lab 06/15/19 2033 06/16/19 0650 06/17/19 0308  NA 141 142 145  K 3.7 4.2 3.6  CL 104 107 108  CO2 24 24 26     GLUCOSE 135* 145* 158*  BUN 10 14 24*  CREATININE 1.11* 0.93 1.07*  CALCIUM 9.0 8.8* 9.4  MG  --  2.3 2.4  AST 32 32 26  ALT 16 15 15   ALKPHOS 62 65 57  BILITOT 0.7 0.7 0.7   ------------------------------------------------------------------------------------------------------------------ Recent Labs    06/15/19 2033  TRIG 129    No results found for: HGBA1C ------------------------------------------------------------------------------------------------------------------ No results for input(s): TSH, T4TOTAL, T3FREE, THYROIDAB in the last 72 hours.  Invalid input(s): FREET3 ------------------------------------------------------------------------------------------------------------------ Recent Labs    06/16/19 0650 06/17/19 0308  FERRITIN 250 322*    Coagulation profile No results for input(s): INR, PROTIME in the last 168 hours.  Recent Labs    06/16/19 0650 06/17/19 0308  DDIMER 2.78* 2.06*    Cardiac Enzymes No results for input(s): CKMB, TROPONINI, MYOGLOBIN in the last 168 hours.  Invalid input(s): CK ------------------------------------------------------------------------------------------------------------------ No results found for: BNP  Micro Results Recent Results (from the past 240 hour(s))  Novel Coronavirus, NAA ([facility])     Status: Abnormal   Collection Time: 06/12/19  1:38 PM   Specimen: Nasopharyngeal(NP) swabs in vial transport medium   NASOPHARYNGE  TESTING  Result Value Ref Range Status   SARS-CoV-2, NAA Detected (A) Not Detected Final    Comment: This nucleic acid amplification test was developed and its performance characteristics determined by [employer]. Nucleic acid amplification tests include RT-PCR and TMA. This test has not been FDA cleared or approved. This test has been authorized by FDA under an Emergency Use Authorization (EUA). This test is only authorized for the duration of time the declaration that  circumstances exist justifying the authorization of the emergency use of in vitro diagnostic tests for detection of SARS-CoV-2 virus and/or diagnosis of COVID-19 infection under section 564(b)(1) of the Act, 21 U.S.C. 408XKG-8(J) (1), unless the authorization is terminated or revoked sooner. When diagnostic testing is negative, the possibility of a false negative result should be considered in the context of a patient's recent exposures and the presence of clinical signs and symptoms consistent with COVID-19. An individual without symptoms of COVID-19 and who is not shedding SARS-CoV-2 virus wo uld expect to have a negative (not detected) result in this assay.   Blood Culture (routine x 2)     Status: None (Preliminary result)   Collection Time: 06/15/19  8:57 PM   Specimen: BLOOD  Result Value Ref Range Status   Specimen Description BLOOD RIGHT ANTECUBITAL  Final   Special Requests   Final    BOTTLES DRAWN AEROBIC AND ANAEROBIC Blood Culture adequate volume   Culture   Final    NO GROWTH 2 DAYS Performed at [facility], [address], [place], [place] [address]    Report Status PENDING  Incomplete  Blood Culture (routine x  2)     Status: None (Preliminary result)   Collection Time: 06/15/19  9:04 PM   Specimen: BLOOD  Result Value Ref Range Status   Specimen Description BLOOD LEFT ANTECUBITAL  Final   Special Requests   Final    BOTTLES DRAWN AEROBIC AND ANAEROBIC Blood Culture results may not be optimal due to an inadequate volume of blood received in culture bottles   Culture   Final    NO GROWTH 2 DAYS Performed at [facility], [address] [place], [place] [address]    Report Status PENDING  Incomplete    Radiology Reports DG Chest Port 1 View  Result Date: 06/15/2019 CLINICAL DATA:  Fever and shortness of breath. COVID positive 3 days ago. EXAM: PORTABLE CHEST 1 VIEW COMPARISON:  Radiograph 08/24/2005 FINDINGS: Low lung volumes. Patchy bilateral  heterogeneous airspace opacities, mid lower lung zone predominant. Heart is normal in size for technique and inspiration. No large pleural effusion. No pneumothorax. No acute osseous abnormalities are seen. IMPRESSION: Patchy bilateral heterogeneous airspace opacities, consistent with COVID pneumonia. Electronically Signed   By: Narda Rutherford M.D.   On: 06/15/2019 21:57

## 2019-06-17 NOTE — Progress Notes (Signed)
Patient scheduled for outpatient Remdesivir infusion at 530PM on Tuesday 1/26 and Wednesday 1/27. Please advise them to report to [facility] at [address].  Drive to the security guard and tell them you are here for an infusion. They will direct you to the front entrance where we will come and get you.  For questions call [phone].  Thanks   

## 2019-06-17 NOTE — Evaluation (Signed)
Physical Therapy Evaluation & Discharge Patient Details Name: Julie Flowers MRN: [id number] DOB: 05-05-1956 Today's Date: 06/17/2019   History of Present Illness  Pt is a 64 y.o. female admitted 06/15/19 with SOB, found to have acute hypoxic respiratory failure secondary to COVID-19 pneumonia; pt dx with COVID on 06/12/19. PMH includes anemia.    Clinical Impression  Patient evaluated by Physical Therapy with no further acute PT needs identified. PTA, pt independent and lives with children's family. Today, pt moving well, although limited by SOB with minimal activity; stability, confidence and energy conservation improved with use of RW; pt would like to use one for a few days upon return home. Other educ re: activity recommendations, pursed lip/deep breathing strategies, importance of mobility. All education has been completed and the patient has no further questions. Acute PT is signing off. Thank you for this referral.  SpO2 93-98% on RA HR 74-108    Follow Up Recommendations No PT follow up;Supervision - Intermittent    Equipment Recommendations  Rolling walker with 5" wheels    Recommendations for Other Services       Precautions / Restrictions Restrictions Weight Bearing Restrictions: No      Mobility  Bed Mobility Overal bed mobility: Modified Independent             General bed mobility comments: HOB elevated  Transfers Overall transfer level: Independent Equipment used: None;Rolling walker (2 wheeled)                Ambulation/Gait Ambulation/Gait assistance: Min guard;Modified independent (Device/Increase time)   Assistive device: None;Rolling walker (2 wheeled) Gait Pattern/deviations: Step-through pattern;Decreased stride length   Gait velocity interpretation: <1.31 ft/sec, indicative of household ambulator General Gait Details: Initial marching in place and taking steps without DME, pt moving very slow and reaching for UE support. Discussed use of  RW for improved confidence and energy conservation; pt mod indep with RW, 1x standing rest break secondary to SOB. SpO2 >/93% on RA  Stairs            Wheelchair Mobility    Modified Rankin (Stroke Patients Only)       Balance Overall balance assessment: Needs assistance   Sitting balance-Leahy Scale: Good       Standing balance-Leahy Scale: Fair                               Pertinent Vitals/Pain Pain Assessment: No/denies pain    Home Living Family/patient expects to be discharged to:: Private residence Living Arrangements: Children;Other relatives Available Help at Discharge: Family;Available 24 hours/day Type of Home: House Home Access: Stairs to enter   [employer] of Steps: 5 Home Layout: One level Home Equipment: None      Prior Function Level of Independence: Independent         Comments: Per Charity fundraiser, works as Journalist, newspaper        Extremity/Trunk Assessment   Upper Extremity Assessment Upper Extremity Assessment: Overall WFL for tasks assessed    Lower Extremity Assessment Lower Extremity Assessment: Overall WFL for tasks assessed       Communication   Communication: No difficulties  Cognition Arousal/Alertness: Awake/alert Behavior During Therapy: WFL for tasks assessed/performed;Flat affect Overall Cognitive Status: Within Functional Limits for tasks assessed  General Comments General comments (skin integrity, edema, etc.): SpO2 93-98% RA, HR 74-108. Pt seems primarily limited by SOB/anxiety with movement; prolonged time discussing and practicing pursed lip/deep breathing; also educ re: energy conservation strategies, seated/standing therex, activity recommendations    Exercises     Assessment/Plan    PT Assessment Patent does not need any further PT services  PT Problem List         PT Treatment Interventions      PT Goals  (Current goals can be found in the Care Plan section)  Acute Rehab PT Goals Patient Stated Goal: Breathe better, stop coughing PT Goal Formulation: With patient Time For Goal Achievement: 07/01/19 Potential to Achieve Goals: Good    Frequency     Barriers to discharge        Co-evaluation               AM-PAC PT "6 Clicks" Mobility  Outcome Measure Help needed turning from your back to your side while in a flat bed without using bedrails?: None Help needed moving from lying on your back to sitting on the side of a flat bed without using bedrails?: None Help needed moving to and from a bed to a chair (including a wheelchair)?: None Help needed standing up from a chair using your arms (e.g., wheelchair or bedside chair)?: None Help needed to walk in hospital room?: None Help needed climbing 3-5 steps with a railing? : A Little 6 Click Score: 23    End of Session   Activity Tolerance: Patient tolerated treatment well;Patient limited by fatigue Patient left: in chair;with call bell/phone within reach Nurse Communication: Mobility status PT Visit Diagnosis: Other abnormalities of gait and mobility (R26.89)    Time: 1350-1415 PT Time Calculation (min) (ACUTE ONLY): 25 min   Charges:   PT Evaluation $PT Eval Moderate Complexity: 1 Mod PT Treatments $Therapeutic Exercise: 8-22 mins   Mabeline Caras, PT, DPT Acute Rehabilitation Services  Pager [phone] Office [place] 06/17/2019, 2:25 PM

## 2019-06-17 NOTE — Discharge Instructions (Addendum)
You are scheduled for an outpatient infusion of Remdesivir at 530PM  Wednesday 1/27 .  Please report to [facility] at [address]  Drive to the security guard and tell them you are here for an infusion. They will direct you to the front entrance where we will come and get you.  For questions call [phone].  Thanks       Person Under Monitoring Name: Julie Flowers  Location: [address] [place] [address]   Infection Prevention Recommendations for Individuals Confirmed to have, or Being Evaluated for, 2019 Novel Coronavirus (COVID-19) Infection Who Receive Care at Home  Individuals who are confirmed to have, or are being evaluated for, COVID-19 should follow the prevention steps below until a healthcare provider or local or state health department says they can return to normal activities.  Stay home except to get medical care You should restrict activities outside your home, except for getting medical care. Do not go to work, school, or public areas, and do not use public transportation or taxis.  Call ahead before visiting your doctor Before your medical appointment, call the healthcare provider and tell them that you have, or are being evaluated for, COVID-19 infection. This will help the healthcare provider's office take steps to keep other people from getting infected. Ask your healthcare provider to call the local or state health department.  Monitor your symptoms Seek prompt medical attention if your illness is worsening (e.g., difficulty breathing). Before going to your medical appointment, call the healthcare provider and tell them that you have, or are being evaluated for, COVID-19 infection. Ask your healthcare provider to call the local or state health department.  Wear a facemask You should wear a facemask that covers your nose and mouth when you are in the same room with other people and when you visit a healthcare provider. People who  live with or visit you should also wear a facemask while they are in the same room with you.  Separate yourself from other people in your home As much as possible, you should stay in a different room from other people in your home. Also, you should use a separate bathroom, if available.  Avoid sharing household items You should not share dishes, drinking glasses, cups, eating utensils, towels, bedding, or other items with other people in your home. After using these items, you should wash them thoroughly with soap and water.  Cover your coughs and sneezes Cover your mouth and nose with a tissue when you cough or sneeze, or you can cough or sneeze into your sleeve. Throw used tissues in a lined trash can, and immediately wash your hands with soap and water for at least 20 seconds or use an alcohol-based hand rub.  Wash your [employer] your hands often and thoroughly with soap and water for at least 20 seconds. You can use an alcohol-based hand sanitizer if soap and water are not available and if your hands are not visibly dirty. Avoid touching your eyes, nose, and mouth with unwashed hands.   Prevention Steps for Caregivers and Household Members of Individuals Confirmed to have, or Being Evaluated for, COVID-19 Infection Being Cared for in the Home  If you live with, or provide care at home for, a person confirmed to have, or being evaluated for, COVID-19 infection please follow these guidelines to prevent infection:  Follow healthcare provider's instructions Make sure that you understand and can help the patient follow any healthcare provider instructions for all  care.  Provide for the patient's basic needs You should help the patient with basic needs in the home and provide support for getting groceries, prescriptions, and other personal needs.  Monitor the patient's symptoms If they are getting sicker, call his or her medical provider and tell them that the patient has, or is  being evaluated for, COVID-19 infection. This will help the healthcare provider's office take steps to keep other people from getting infected. Ask the healthcare provider to call the local or state health department.  Limit the number of people who have contact with the patient  If possible, have only one caregiver for the patient.  Other household members should stay in another home or place of residence. If this is not possible, they should stay  in another room, or be separated from the patient as much as possible. Use a separate bathroom, if available.  Restrict visitors who do not have an essential need to be in the home.  Keep older adults, very young children, and other sick people away from the patient Keep older adults, very young children, and those who have compromised immune systems or chronic health conditions away from the patient. This includes people with chronic heart, lung, or kidney conditions, diabetes, and cancer.  Ensure good ventilation Make sure that shared spaces in the home have good air flow, such as from an air conditioner or an opened window, weather permitting.  Wash your hands often  Wash your hands often and thoroughly with soap and water for at least 20 seconds. You can use an alcohol based hand sanitizer if soap and water are not available and if your hands are not visibly dirty.  Avoid touching your eyes, nose, and mouth with unwashed hands.  Use disposable paper towels to dry your hands. If not available, use dedicated cloth towels and replace them when they become wet.  Wear a facemask and gloves  Wear a disposable facemask at all times in the room and gloves when you touch or have contact with the patient's blood, body fluids, and/or secretions or excretions, such as sweat, saliva, sputum, nasal mucus, vomit, urine, or feces.  Ensure the mask fits over your nose and mouth tightly, and do not touch it during use.  Throw out disposable facemasks  and gloves after using them. Do not reuse.  Wash your hands immediately after removing your facemask and gloves.  If your personal clothing becomes contaminated, carefully remove clothing and launder. Wash your hands after handling contaminated clothing.  Place all used disposable facemasks, gloves, and other waste in a lined container before disposing them with other household waste.  Remove gloves and wash your hands immediately after handling these items.  Do not share dishes, glasses, or other household items with the patient  Avoid sharing household items. You should not share dishes, drinking glasses, cups, eating utensils, towels, bedding, or other items with a patient who is confirmed to have, or being evaluated for, COVID-19 infection.  After the person uses these items, you should wash them thoroughly with soap and water.  Wash laundry thoroughly  Immediately remove and wash clothes or bedding that have blood, body fluids, and/or secretions or excretions, such as sweat, saliva, sputum, nasal mucus, vomit, urine, or feces, on them.  Wear gloves when handling laundry from the patient.  Read and follow directions on labels of laundry or clothing items and detergent. In general, wash and dry with the warmest temperatures recommended on the label.  Clean all areas  the individual has used often  Clean all touchable surfaces, such as counters, tabletops, doorknobs, bathroom fixtures, toilets, phones, keyboards, tablets, and bedside tables, every day. Also, clean any surfaces that may have blood, body fluids, and/or secretions or excretions on them.  Wear gloves when cleaning surfaces the patient has come in contact with.  Use a diluted bleach solution (e.g., dilute bleach with 1 part bleach and 10 parts water) or a household disinfectant with a label that says EPA-registered for coronaviruses. To make a bleach solution at home, add 1 tablespoon of bleach to 1 quart (4 cups) of water.  For a larger supply, add  cup of bleach to 1 gallon (16 cups) of water.  Read labels of cleaning products and follow recommendations provided on product labels. Labels contain instructions for safe and effective use of the cleaning product including precautions you should take when applying the product, such as wearing gloves or eye protection and making sure you have good ventilation during use of the product.  Remove gloves and wash hands immediately after cleaning.  Monitor yourself for signs and symptoms of illness Caregivers and household members are considered close contacts, should monitor their health, and will be asked to limit movement outside of the home to the extent possible. Follow the monitoring steps for close contacts listed on the symptom monitoring form.   ? If you have additional questions, contact your local health department or call the epidemiologist on call at [phone] (available 24/7). ? This guidance is subject to change. For the most up-to-date guidance from [facility], please refer to their website: [email or web address]

## 2019-06-17 NOTE — Plan of Care (Signed)
Patient status unchanged, remains alert on room air, no complaints, complained of dizziness upon standing, received 2nd dose of remdesivir, vitals remained wnl, safety precautions maintained, continue plan of care Problem: Education: Goal: Knowledge of risk factors and measures for prevention of condition will improve Outcome: Progressing   Problem: Coping: Goal: Psychosocial and spiritual needs will be supported Outcome: Progressing   Problem: Respiratory: Goal: Will maintain a patent airway Outcome: Progressing Goal: Complications related to the disease process, condition or treatment will be avoided or minimized Outcome: Progressing   Problem: Education: Goal: Knowledge of General Education information will improve Description: Including pain rating scale, medication(s)/side effects and non-pharmacologic comfort measures Outcome: Progressing   Problem: Clinical Measurements: Goal: Ability to maintain clinical measurements within normal limits will improve Outcome: Progressing Goal: Will remain free from infection Outcome: Progressing Goal: Diagnostic test results will improve Outcome: Progressing Goal: Respiratory complications will improve Outcome: Progressing Goal: Cardiovascular complication will be avoided Outcome: Progressing   Problem: Activity: Goal: Risk for activity intolerance will decrease Outcome: Progressing   Problem: Nutrition: Goal: Adequate nutrition will be maintained Outcome: Progressing   Problem: Safety: Goal: Ability to remain free from injury will improve Outcome: Progressing

## 2019-06-18 ENCOUNTER — Ambulatory Visit (HOSPITAL_COMMUNITY): Payer: BC Managed Care – PPO

## 2019-06-18 LAB — COMPREHENSIVE METABOLIC PANEL
ALT: 15 U/L (ref 0–44)
AST: 26 U/L (ref 15–41)
Albumin: 3.2 g/dL — ABNORMAL LOW (ref 3.5–5.0)
Alkaline Phosphatase: 57 U/L (ref 38–126)
Anion gap: 11 (ref 5–15)
BUN: 28 mg/dL — ABNORMAL HIGH (ref 8–23)
CO2: 26 mmol/L (ref 22–32)
Calcium: 9.4 mg/dL (ref 8.9–10.3)
Chloride: 108 mmol/L (ref 98–111)
Creatinine, Ser: 0.99 mg/dL (ref 0.44–1.00)
GFR calc Af Amer: >60 mL/min (ref >60)
GFR calc non Af Amer: 60 mL/min — ABNORMAL LOW (ref >60)
Glucose, Bld: 152 mg/dL — ABNORMAL HIGH (ref 70–99)
Potassium: 3.5 mmol/L (ref 3.5–5.1)
Sodium: 145 mmol/L (ref 135–145)
Total Bilirubin: 0.7 mg/dL (ref 0.3–1.2)
Total Protein: 8.2 g/dL — ABNORMAL HIGH (ref 6.5–8.1)

## 2019-06-18 LAB — CBC WITH DIFFERENTIAL/PLATELET
Abs Immature Granulocytes: 0.84 10*3/uL — ABNORMAL HIGH (ref 0.00–0.07)
Basophils Absolute: 0.1 10*3/uL (ref 0.0–0.1)
Basophils Relative: 1 %
Eosinophils Absolute: 0 10*3/uL (ref 0.0–0.5)
Eosinophils Relative: 0 %
HCT: 37.5 % (ref 36.0–46.0)
Hemoglobin: 12.6 g/dL (ref 12.0–15.0)
Immature Granulocytes: 6 %
Lymphocytes Relative: 9 %
Lymphs Abs: 1.4 10*3/uL (ref 0.7–4.0)
MCH: 28.7 pg (ref 26.0–34.0)
MCHC: 33.6 g/dL (ref 30.0–36.0)
MCV: 85.4 fL (ref 80.0–100.0)
Monocytes Absolute: 0.6 10*3/uL (ref 0.1–1.0)
Monocytes Relative: 4 %
Neutro Abs: 12.4 10*3/uL — ABNORMAL HIGH (ref 1.7–7.7)
Neutrophils Relative %: 80 %
Platelets: 401 10*3/uL — ABNORMAL HIGH (ref 150–400)
RBC: 4.39 MIL/uL (ref 3.87–5.11)
RDW: 13.3 % (ref 11.5–15.5)
WBC: 15.4 10*3/uL — ABNORMAL HIGH (ref 4.0–10.5)
nRBC: 0.1 % (ref 0.0–0.2)

## 2019-06-18 LAB — FERRITIN: Ferritin: 310 ng/mL — ABNORMAL HIGH (ref 11–307)

## 2019-06-18 LAB — D-DIMER, QUANTITATIVE: D-Dimer, Quant: 2.29 ug/mL-FEU — ABNORMAL HIGH (ref 0.00–0.50)

## 2019-06-18 LAB — C-REACTIVE PROTEIN: CRP: 7 mg/dL — ABNORMAL HIGH (ref <1.0)

## 2019-06-18 MED ORDER — AMLODIPINE BESYLATE 5 MG PO TABS
5.0000 mg | ORAL_TABLET | Freq: Every day | ORAL | Status: DC
  Administered 2019-06-18: 5 mg via ORAL
  Filled 2019-06-18: qty 1

## 2019-06-18 MED ORDER — AMLODIPINE BESYLATE 5 MG PO TABS: 5.0000 mg | ORAL_TABLET | Freq: Every day | ORAL | 0 refills | Status: DC

## 2019-06-18 NOTE — Plan of Care (Signed)
Pt A&Ox4. VSS, SpO2 > 87% on RA. No c/o pain throughout shift. Ambulating to bathroom independently w/o issue at baseline  Given Remdesevir before d/c  Spoke w/ family about d/c planning. D.c paperwork reviewed w pt, stated understanding & all questions answered  PIV & tele removed. Pt left hospital w/ all belongings  Berneice Heinrich    Problem: Education: Goal: Knowledge of risk factors and measures for prevention of condition will improve Outcome: Adequate for Discharge   Problem: Coping: Goal: Psychosocial and spiritual needs will be supported Outcome: Adequate for Discharge   Problem: Respiratory: Goal: Will maintain a patent airway Outcome: Adequate for Discharge Goal: Complications related to the disease process, condition or treatment will be avoided or minimized Outcome: Adequate for Discharge   Problem: Education: Goal: Knowledge of General Education information will improve Description: Including pain rating scale, medication(s)/side effects and non-pharmacologic comfort measures Outcome: Adequate for Discharge   Problem: Clinical Measurements: Goal: Ability to maintain clinical measurements within normal limits will improve Outcome: Adequate for Discharge Goal: Will remain free from infection Outcome: Adequate for Discharge Goal: Diagnostic test results will improve Outcome: Adequate for Discharge Goal: Respiratory complications will improve Outcome: Adequate for Discharge Goal: Cardiovascular complication will be avoided Outcome: Adequate for Discharge   Problem: Activity: Goal: Risk for activity intolerance will decrease Outcome: Adequate for Discharge   Problem: Nutrition: Goal: Adequate nutrition will be maintained Outcome: Adequate for Discharge   Problem: Safety: Goal: Ability to remain free from injury will improve Outcome: Adequate for Discharge

## 2019-06-18 NOTE — Progress Notes (Signed)
DNE walking roller ordered. Pr refusing, stating that her home isnt that big & she won't need/use it at home. She "gets around fine w.o it & walks well. Just had that one episode when she first came in w/ an issue walking". Pt has been ambulating to bathroom & around room fine independently w/o issue.  Berneice Heinrich

## 2019-06-18 NOTE — Plan of Care (Signed)
Status unchanged overnight, remains on Room air, likely D/c today Problem: Education: Goal: Knowledge of risk factors and measures for prevention of condition will improve Outcome: Progressing   Problem: Coping: Goal: Psychosocial and spiritual needs will be supported Outcome: Progressing   Problem: Respiratory: Goal: Will maintain a patent airway Outcome: Progressing Goal: Complications related to the disease process, condition or treatment will be avoided or minimized Outcome: Progressing   Problem: Education: Goal: Knowledge of General Education information will improve Description: Including pain rating scale, medication(s)/side effects and non-pharmacologic comfort measures Outcome: Progressing   Problem: Clinical Measurements: Goal: Ability to maintain clinical measurements within normal limits will improve Outcome: Progressing Goal: Will remain free from infection Outcome: Progressing Goal: Diagnostic test results will improve Outcome: Progressing Goal: Respiratory complications will improve Outcome: Progressing Goal: Cardiovascular complication will be avoided Outcome: Progressing   Problem: Activity: Goal: Risk for activity intolerance will decrease Outcome: Progressing   Problem: Nutrition: Goal: Adequate nutrition will be maintained Outcome: Progressing   Problem: Safety: Goal: Ability to remain free from injury will improve Outcome: Progressing

## 2019-06-19 ENCOUNTER — Ambulatory Visit (HOSPITAL_COMMUNITY)
Admission: RE | Admit: 2019-06-19 | Discharge: 2019-06-19 | Disposition: A | Discharge status: Home / Self Care | Payer: BC Managed Care – PPO | Source: Ambulatory Visit | Attending: Pulmonary Disease | Admitting: Pulmonary Disease

## 2019-06-19 DIAGNOSIS — U071 COVID-19: Secondary | ICD-10-CM | POA: Insufficient documentation

## 2019-06-19 DIAGNOSIS — J1282 Pneumonia due to coronavirus disease 2019: Secondary | ICD-10-CM | POA: Insufficient documentation

## 2019-06-19 MED ORDER — SODIUM CHLORIDE 0.9 % IV SOLN
INTRAVENOUS | Status: AC
  Administered 2019-06-19: 100 mg via INTRAVENOUS
  Filled 2019-06-19: qty 20

## 2019-06-19 MED ORDER — METHYLPREDNISOLONE SODIUM SUCC 125 MG IJ SOLR: 125.0000 mg | Freq: Once | INTRAMUSCULAR | Status: DC | PRN

## 2019-06-19 MED ORDER — EPINEPHRINE 0.3 MG/0.3ML IJ SOAJ: 0.3000 mg | Freq: Once | INTRAMUSCULAR | Status: DC | PRN

## 2019-06-19 MED ORDER — SODIUM CHLORIDE 0.9 % IV SOLN: INTRAVENOUS | Status: DC | PRN

## 2019-06-19 MED ORDER — DIPHENHYDRAMINE HCL 50 MG/ML IJ SOLN: 50.0000 mg | Freq: Once | INTRAMUSCULAR | Status: DC | PRN

## 2019-06-19 MED ORDER — SODIUM CHLORIDE 0.9 % IV SOLN: 100.0000 mg | Freq: Once | INTRAVENOUS | Status: AC

## 2019-06-19 MED ORDER — FAMOTIDINE IN NACL 20-0.9 MG/50ML-% IV SOLN: 20.0000 mg | Freq: Once | INTRAVENOUS | Status: DC | PRN

## 2019-06-19 MED ORDER — ALBUTEROL SULFATE HFA 108 (90 BASE) MCG/ACT IN AERS: 2.0000 | INHALATION_SPRAY | Freq: Once | RESPIRATORY_TRACT | Status: DC | PRN

## 2019-06-19 NOTE — Progress Notes (Signed)
  Diagnosis: COVID-19  Physician: Dr. Delford Field Procedure: [facility] Med: remdesivir infusion.  Complications: No immediate complications noted.  Discharge: Discharged home   Julie Flowers 06/19/2019

## 2019-06-19 NOTE — Progress Notes (Signed)
  Diagnosis: COVID-19  Physician: Dr. Wright Procedure: [facility] Med: remdesivir infusion.  Complications: No immediate complications noted.  Discharge: Discharged home   Ally Yow 06/19/2019   

## 2019-06-20 LAB — CULTURE, BLOOD (ROUTINE X 2)
Culture: NO GROWTH
Culture: NO GROWTH
Special Requests: ADEQUATE

## 2019-07-05 ENCOUNTER — Ambulatory Visit (HOSPITAL_COMMUNITY)
Admission: EM | Admit: 2019-07-05 | Discharge: 2019-07-05 | Disposition: A | Discharge status: Home / Self Care | Payer: BC Managed Care – PPO

## 2019-07-05 ENCOUNTER — Other Ambulatory Visit: Payer: Self-pay

## 2019-07-05 ENCOUNTER — Encounter (HOSPITAL_COMMUNITY): Payer: Self-pay

## 2019-07-05 DIAGNOSIS — U071 COVID-19: Secondary | ICD-10-CM

## 2019-07-05 DIAGNOSIS — Z0289 Encounter for other administrative examinations: Secondary | ICD-10-CM | POA: Diagnosis not present

## 2019-07-05 DIAGNOSIS — Z8616 Personal history of COVID-19: Secondary | ICD-10-CM | POA: Diagnosis not present

## 2019-07-05 NOTE — ED Triage Notes (Addendum)
Pt is here needing a standard work note, she has completed her quarantine period. She brought a [employer] work note, she informed me she does NOT have a PCP.

## 2019-07-05 NOTE — ED Provider Notes (Signed)
[facility]   MRN: [id number] DOB: [birth year]  Subjective:   Julie Flowers is a 64 y.o. female presenting for note to return to work.  Patient was hospitalized at the end of January for COVID-19.  Patient was to be on isolation for 3 weeks from 06/12/2019 and therefore completed her quarantine as of yesterday.  She requires a note to return to work without restriction.  Denies any symptoms currently.  Does not have a primary care provider.  No current facility-administered medications for this encounter.  Current Outpatient Medications:  .  amLODipine (NORVASC) 5 MG tablet, Take 1 tablet (5 mg total) by mouth daily., Disp: 30 tablet, Rfl: 0 .  benzonatate (TESSALON PERLES) 100 MG capsule, Take 1 capsule (100 mg total) by mouth 3 (three) times daily as needed for cough., Disp: 30 capsule, Rfl: 0 .  predniSONE (DELTASONE) 10 MG tablet, Take 40 mg daily for 1 day, 30 mg daily for 1 day, 20 mg daily for 1 days,10 mg daily for 1 day, then stop, Disp: 10 tablet, Rfl: 0   No Known Allergies  Past Medical History:  Diagnosis Date  . Anemia   . History of uterine fibroid   . Pre-diabetes      Past Surgical History:  Procedure Laterality Date  . BREAST LUMPECTOMY Right 10+ years ago   Benign  . CYSTECTOMY  10 years ago   wrist   . LAPAROSCOPIC VAGINAL HYSTERECTOMY  15 years ago   due to fibroids    Family History  Problem Relation Age of Onset  . Diabetes Mother   . Leukemia Maternal Uncle   . Diabetes Maternal Uncle     Social History   Tobacco Use  . Smoking status: Never Smoker  . Smokeless tobacco: Never Used  Substance Use Topics  . Alcohol use: Never  . Drug use: Never    ROS   Objective:   Vitals: BP (!) 150/67 (BP Location: Right Arm)   Pulse 73   Temp 98 F (36.7 C) (Oral)   Resp 19   Wt 204 lb (92.5 kg)   LMP 07/24/2003   SpO2 100%   BMI 34.21 kg/m   Physical Exam Constitutional:      General: She is not in acute distress.  Appearance: Normal appearance. She is well-developed. She is obese. She is not ill-appearing, toxic-appearing or diaphoretic.  HENT:     Head: Normocephalic and atraumatic.     Nose: Nose normal.     Mouth/Throat:     Mouth: Mucous membranes are moist.     Pharynx: Oropharynx is clear.  Eyes:     General: No scleral icterus.    Extraocular Movements: Extraocular movements intact.     Pupils: Pupils are equal, round, and reactive to light.  Cardiovascular:     Rate and Rhythm: Normal rate.  Pulmonary:     Effort: Pulmonary effort is normal.  Skin:    General: Skin is warm and dry.  Neurological:     General: No focal deficit present.     Mental Status: She is alert and oriented to person, place, and time.  Psychiatric:        Mood and Affect: Mood normal.        Behavior: Behavior normal.        Thought Content: Thought content normal.        Judgment: Judgment normal.     Assessment and Plan :   1. COVID-19 virus infection  Patient completed her quarantine.  Note provided to patient to return to work.  Provided her with information thank you to [facility] internal medicine to get help establishing care with a new PCP. Counseled patient on potential for adverse effects with medications prescribed/recommended today, ER and return-to-clinic precautions discussed, patient verbalized understanding.   Jaynee Eagles, PA-C 07/05/19 1431

## 2019-07-12 DIAGNOSIS — I10 Essential (primary) hypertension: Secondary | ICD-10-CM | POA: Diagnosis not present

## 2019-07-12 DIAGNOSIS — J1282 Pneumonia due to coronavirus disease 2019: Secondary | ICD-10-CM | POA: Diagnosis not present

## 2019-07-12 DIAGNOSIS — U071 COVID-19: Secondary | ICD-10-CM | POA: Diagnosis not present

## 2019-08-06 ENCOUNTER — Other Ambulatory Visit: Payer: Self-pay

## 2019-09-13 ENCOUNTER — Other Ambulatory Visit: Payer: Self-pay | Admitting: Family Medicine

## 2019-09-13 ENCOUNTER — Other Ambulatory Visit (HOSPITAL_COMMUNITY)
Admission: RE | Admit: 2019-09-13 | Discharge: 2019-09-13 | Disposition: A | Discharge status: Home / Self Care | Payer: BC Managed Care – PPO | Source: Ambulatory Visit | Attending: Family Medicine | Admitting: Family Medicine

## 2019-09-13 DIAGNOSIS — Z124 Encounter for screening for malignant neoplasm of cervix: Secondary | ICD-10-CM | POA: Insufficient documentation

## 2019-09-13 DIAGNOSIS — Z113 Encounter for screening for infections with a predominantly sexual mode of transmission: Secondary | ICD-10-CM | POA: Diagnosis not present

## 2019-09-13 DIAGNOSIS — Z0000 Encounter for general adult medical examination without abnormal findings: Secondary | ICD-10-CM | POA: Diagnosis not present

## 2019-09-16 ENCOUNTER — Other Ambulatory Visit: Payer: Self-pay | Admitting: Family Medicine

## 2019-09-16 DIAGNOSIS — E2839 Other primary ovarian failure: Secondary | ICD-10-CM

## 2019-09-17 LAB — CYTOLOGY - PAP: Diagnosis: NEGATIVE

## 2019-09-19 ENCOUNTER — Other Ambulatory Visit: Payer: Self-pay | Admitting: Family Medicine

## 2019-09-19 DIAGNOSIS — Z1231 Encounter for screening mammogram for malignant neoplasm of breast: Secondary | ICD-10-CM

## 2019-09-20 ENCOUNTER — Other Ambulatory Visit: Payer: Self-pay

## 2019-09-20 ENCOUNTER — Ambulatory Visit
Admission: RE | Admit: 2019-09-20 | Discharge: 2019-09-20 | Disposition: A | Discharge status: Home / Self Care | Payer: BC Managed Care – PPO | Source: Ambulatory Visit | Attending: Family Medicine | Admitting: Family Medicine

## 2019-09-20 DIAGNOSIS — Z0000 Encounter for general adult medical examination without abnormal findings: Secondary | ICD-10-CM | POA: Diagnosis not present

## 2019-09-20 DIAGNOSIS — Z1231 Encounter for screening mammogram for malignant neoplasm of breast: Secondary | ICD-10-CM | POA: Diagnosis not present

## 2019-09-20 DIAGNOSIS — Z113 Encounter for screening for infections with a predominantly sexual mode of transmission: Secondary | ICD-10-CM | POA: Diagnosis not present

## 2019-09-20 DIAGNOSIS — R7301 Impaired fasting glucose: Secondary | ICD-10-CM | POA: Diagnosis not present

## 2019-09-20 DIAGNOSIS — Z13220 Encounter for screening for lipoid disorders: Secondary | ICD-10-CM | POA: Diagnosis not present

## 2019-09-26 ENCOUNTER — Other Ambulatory Visit: Payer: Self-pay

## 2019-09-26 ENCOUNTER — Ambulatory Visit (INDEPENDENT_AMBULATORY_CARE_PROVIDER_SITE_OTHER)
Admission: RE | Admit: 2019-09-26 | Discharge: 2019-09-26 | Disposition: A | Discharge status: Home / Self Care | Payer: BC Managed Care – PPO | Source: Ambulatory Visit | Attending: Internal Medicine | Admitting: Internal Medicine

## 2019-09-26 ENCOUNTER — Other Ambulatory Visit (INDEPENDENT_AMBULATORY_CARE_PROVIDER_SITE_OTHER): Payer: BC Managed Care – PPO

## 2019-09-26 ENCOUNTER — Encounter: Payer: Self-pay | Admitting: Internal Medicine

## 2019-09-26 ENCOUNTER — Ambulatory Visit (INDEPENDENT_AMBULATORY_CARE_PROVIDER_SITE_OTHER): Payer: BC Managed Care – PPO | Admitting: Internal Medicine

## 2019-09-26 VITALS — BP 120/78 | HR 74 | Temp 97.0°F | Ht 64.75 in | Wt 217.0 lb

## 2019-09-26 DIAGNOSIS — R0600 Dyspnea, unspecified: Secondary | ICD-10-CM | POA: Diagnosis not present

## 2019-09-26 DIAGNOSIS — Z8616 Personal history of COVID-19: Secondary | ICD-10-CM

## 2019-09-26 DIAGNOSIS — R0602 Shortness of breath: Secondary | ICD-10-CM | POA: Diagnosis not present

## 2019-09-26 DIAGNOSIS — R0609 Other forms of dyspnea: Secondary | ICD-10-CM

## 2019-09-26 DIAGNOSIS — R7989 Other specified abnormal findings of blood chemistry: Secondary | ICD-10-CM | POA: Diagnosis not present

## 2019-09-26 LAB — CBC WITH DIFFERENTIAL/PLATELET
Basophils Absolute: 0 10*3/uL (ref 0.0–0.1)
Basophils Relative: 0.5 % (ref 0.0–3.0)
Eosinophils Absolute: 0.1 10*3/uL (ref 0.0–0.7)
Eosinophils Relative: 1.8 % (ref 0.0–5.0)
HCT: 35.7 % — ABNORMAL LOW (ref 36.0–46.0)
Hemoglobin: 12.1 g/dL (ref 12.0–15.0)
Lymphocytes Relative: 44 % (ref 12.0–46.0)
Lymphs Abs: 3 10*3/uL (ref 0.7–4.0)
MCHC: 33.9 g/dL (ref 30.0–36.0)
MCV: 86.6 fl (ref 78.0–100.0)
Monocytes Absolute: 0.5 10*3/uL (ref 0.1–1.0)
Monocytes Relative: 7.6 % (ref 3.0–12.0)
Neutro Abs: 3.1 10*3/uL (ref 1.4–7.7)
Neutrophils Relative %: 46.1 % (ref 43.0–77.0)
Platelets: 282 10*3/uL (ref 150.0–400.0)
RBC: 4.12 Mil/uL (ref 3.87–5.11)
RDW: 14.4 % (ref 11.5–15.5)
WBC: 6.8 10*3/uL (ref 4.0–10.5)

## 2019-09-26 LAB — BASIC METABOLIC PANEL
BUN: 16 mg/dL (ref 6–23)
CO2: 29 mEq/L (ref 19–32)
Calcium: 9.3 mg/dL (ref 8.4–10.5)
Chloride: 110 mEq/L (ref 96–112)
Creatinine, Ser: 1.13 mg/dL (ref 0.40–1.20)
GFR: 58.28 mL/min — ABNORMAL LOW (ref >60.00)
Glucose, Bld: 95 mg/dL (ref 70–99)
Potassium: 3.7 mEq/L (ref 3.5–5.1)
Sodium: 141 mEq/L (ref 135–145)

## 2019-09-26 LAB — BRAIN NATRIURETIC PEPTIDE: Pro B Natriuretic peptide (BNP): 17 pg/mL (ref 0.0–100.0)

## 2019-09-26 NOTE — Patient Instructions (Addendum)
ICD-10-CM   1. History of 2019 novel coronavirus disease (COVID-19)  Z86.16   2. Elevated d-dimer  R79.89   3. Dyspnea on exertion  R06.00     I think most likely just a physical deconditioning following Covid Need to rule out any cardiac issues because of Covid Need to make sure there is no evidence of blood clot in the lungs or in the legs or other issues  Plan -Check chest x-ray to be today -Check CBC and chemistry, BNP and D-dimer today -Check 2D echocardiogram  Follow-up -15-minute phone visit or video visit with nurse practitioner in 1-2 weeks but after completing the above to review results  -If these results are normal we will do pulmonary rehabilitation and see you back in 6 months

## 2019-09-26 NOTE — Progress Notes (Signed)
OV 09/26/2019  Subjective:  Patient ID: Julie Flowers, female , DOB: 01-19-1956 , age 64 y.o. , MRN: [id number] , ADDRESS: [address] [place] [address]   09/26/2019 -   Chief Complaint  Patient presents with  . Consult    Pt is being referred by Dr. Tracie Harrier due to SOB post covid. Pt states SOB is with exertion. Pt denies any complaints of cough or chest discomfort.     HPI Julie Flowers 64 y.o. -works in Designer, fashion/clothing and part-time Biomedical scientist.  She has obesity and hypertension.  Developed COVID-19 in January 2021.  Treated at [facility] with standard protocol.  Does not appear to have been ventilated with mechanical ventilation and intubation.  She was hospitalized for 3 days ending June 18, 2019.  She was given prednisone at discharge.  She did not need oxygen.  She says overall her course is 1 of improvement.  She is steadily improving.  However she has significant exertional fatigue and shortness of breath.  Nevertheless she was able to do 3 laps today in our office and did not desaturate.  She is got tachycardic.  She denies any previous sleep apnea or asthma.  Symptom and walk scores are listed below.No wheezzing  Review of records indicates she has not had an echo.  Last chest x-ray was June 15, 2019 at admission with bilateral pulmonary infiltrates.  Personally visualized.   She had acute kidney injury at hospitalization this normalized at discharge to creatinine 0.99 mg percent.  Hemoglobin at discharge 12.6 g%.  CRP at discharge 7.  D-dimer at discharge was elevated at 2.29  9 she denies any current pedal edema  SYMPTOM SCALE - ILD 09/26/2019   O2 use ra  Shortness of Breath 0 -> 5 scale with 5 being worst (score 6 If unable to do)  At rest 0  Simple tasks - showers, clothes change, eating, shaving 0  Household (dishes, doing bed, laundry) 1  Shopping 3  Walking level at own pace 2  Walking up Stairs 3  Total (30-36) Dyspnea Score 9  How bad is  your cough? 0  How bad is your fatigue 0  How bad is nausea 0  How bad is vomiting?  0  How bad is diarrhea? 0  How bad is anxiety? 0  How bad is depression 0        Simple office walk 185 feet x  3 laps goal with forehead probe 09/26/2019   O2 used ra  Number laps completed 3  Comments about pace avg  Resting Pulse Ox/HR 100% and 74/min  Final Pulse Ox/HR 98% and 107/min  Desaturated </= 88% no  Desaturated <= 3% points no  Got Tachycardic >/= 90/min yes  Symptoms at end of test Mild dspnea  Miscellaneous comments x       ROS - per HPI     has a past medical history of Anemia, History of uterine fibroid, and Pre-diabetes.   reports that she has never smoked. She has never used smokeless tobacco.  Past Surgical History:  Procedure Laterality Date  . BREAST LUMPECTOMY Right 10+ years ago   Benign  . CYSTECTOMY  10 years ago   wrist   . LAPAROSCOPIC VAGINAL HYSTERECTOMY  15 years ago   due to fibroids    No Known Allergies   There is no immunization history on file for this patient.  Family History  Problem Relation Age of Onset  .  Diabetes Mother   . Leukemia Maternal Uncle   . Diabetes Maternal Uncle     No current outpatient medications on file.      Objective:   Vitals:   09/26/19 1418  BP: 120/78  Pulse: 74  Temp: (!) 97 F (36.1 C)  TempSrc: Temporal  SpO2: 100%  Weight: 217 lb (98.4 kg)  Height: 5' 4.75" (1.645 m)    Estimated body mass index is 36.39 kg/m as calculated from the following:   Height as of this encounter: 5' 4.75" (1.645 m).   Weight as of this encounter: 217 lb (98.4 kg).  @WEIGHTCHANGE @  [employer]   09/26/19 1418  Weight: 217 lb (98.4 kg)     Physical Exam  General Appearance:    Alert, cooperative, no distress, appears stated age - yes , Deconditioned looking - mild yes , OBESE  - yes, Sitting on Wheelchair -  no  Head:    Normocephalic, without obvious abnormality, atraumatic  Eyes:    PERRL,  conjunctiva/corneas clear,  Ears:    Normal TM's and external ear canals, both ears  Nose:   Nares normal, septum midline, mucosa normal, no drainage    or sinus tenderness. OXYGEN ON  - no . Patient is @ ra   Throat:   Lips, mucosa, and tongue normal; teeth and gums normal. Cyanosis on lips - no  Neck:   Supple, symmetrical, trachea midline, no adenopathy;    thyroid:  no enlargement/tenderness/nodules; no carotid   bruit or JVD  Back:     Symmetric, no curvature, ROM normal, no CVA tenderness  Lungs:     Distress - no , Wheeze no, Barrell Chest - no, Purse lip breathing - no, Crackles - no   Chest Wall:    No tenderness or deformity.    Heart:    Regular rate and rhythm, S1 and S2 normal, no rub   or gallop, Murmur - no  Breast Exam:    NOT DONE  Abdomen:     Soft, non-tender, bowel sounds active all four quadrants,    no masses, no organomegaly. Visceral obesity - yes  Genitalia:   NOT DONE  Rectal:   NOT DONE  Extremities:   Extremities - normal, Has Cane - no, Clubbing - no, Edema - no  Pulses:   2+ and symmetric all extremities  Skin:   Stigmata of Connective Tissue Disease - no  Lymph nodes:   Cervical, supraclavicular, and axillary nodes normal  Psychiatric:  Neurologic:   Pleasant - yes, Anxious - no, Flat affect - no  CAm-ICU - neg, Alert and Oriented x 3 - yes, Moves all 4s - yes, Speech - normal, Cognition - intact           Assessment:       ICD-10-CM   1. History of 2019 novel coronavirus disease (COVID-19)  Z86.16   2. Elevated d-dimer  R79.89   3. Dyspnea on exertion  R06.00        Plan:     Patient Instructions     ICD-10-CM   1. History of 2019 novel coronavirus disease (COVID-19)  Z86.16   2. Elevated d-dimer  R79.89   3. Dyspnea on exertion  R06.00     I think most likely just a physical deconditioning following Covid Need to rule out any cardiac issues because of Covid Need to make sure there is no evidence of blood clot in the lungs or in the  legs or  other issues  Plan -Check chest x-ray to be today -Check CBC and chemistry, BNP and D-dimer today -Check 2D echocardiogram  Follow-up -15-minute phone visit or video visit with nurse practitioner in 1-2 weeks but after completing the above to review results  -If these results are normal we will do pulmonary rehabilitation and see you back in 6 months     SIGNATURE    Dr. Kalman Shan, M.D., F.C.C.P,  Pulmonary and Critical Care Medicine Staff Physician, [facility] Center Director - Interstitial Lung Disease  Program  Pulmonary Fibrosis [facility] at [facility] [place], [place], [address]  Pager: [phone], If no answer or between  15:00h - 7:00h: call [phone] Telephone: [phone]  3:07 PM 09/26/2019

## 2019-09-27 ENCOUNTER — Telehealth: Payer: Self-pay | Admitting: Internal Medicine

## 2019-09-27 DIAGNOSIS — E669 Obesity, unspecified: Secondary | ICD-10-CM | POA: Diagnosis not present

## 2019-09-27 DIAGNOSIS — R7989 Other specified abnormal findings of blood chemistry: Secondary | ICD-10-CM

## 2019-09-27 DIAGNOSIS — B1910 Unspecified viral hepatitis B without hepatic coma: Secondary | ICD-10-CM | POA: Diagnosis not present

## 2019-09-27 DIAGNOSIS — Z8616 Personal history of COVID-19: Secondary | ICD-10-CM

## 2019-09-27 DIAGNOSIS — H1013 Acute atopic conjunctivitis, bilateral: Secondary | ICD-10-CM | POA: Diagnosis not present

## 2019-09-27 DIAGNOSIS — R7303 Prediabetes: Secondary | ICD-10-CM | POA: Diagnosis not present

## 2019-09-27 LAB — D-DIMER, QUANTITATIVE: D-Dimer, Quant: 0.59 mcg/mL FEU — ABNORMAL HIGH (ref <0.50)

## 2019-09-27 NOTE — Progress Notes (Signed)
Significnat improvement in CXR  Plan  - followup per OV plan from yesterday

## 2019-09-27 NOTE — Progress Notes (Signed)
Called patient to give result of cxr.  Pt. Verbalized understanding.  No questions.

## 2019-09-27 NOTE — Telephone Encounter (Signed)
D-dimer still elevated.  Although it is coming down and just over the limit of high normal   Plan- -To be on the safe side get duplex lower extremity DVT with indication - COVID-19 and high D-dimer

## 2019-09-30 NOTE — Telephone Encounter (Signed)
Called and spoke with pt letting her know the results of labwork and that MR wants her to have doppler study. Pt verbalized understanding. Order placed. Nothing further needed.

## 2019-10-02 ENCOUNTER — Ambulatory Visit (INDEPENDENT_AMBULATORY_CARE_PROVIDER_SITE_OTHER)
Admission: RE | Admit: 2019-10-02 | Discharge: 2019-10-02 | Disposition: A | Discharge status: Home / Self Care | Payer: BC Managed Care – PPO | Source: Ambulatory Visit | Attending: Internal Medicine | Admitting: Internal Medicine

## 2019-10-02 ENCOUNTER — Other Ambulatory Visit: Payer: Self-pay

## 2019-10-02 ENCOUNTER — Telehealth: Payer: Self-pay | Admitting: Internal Medicine

## 2019-10-02 ENCOUNTER — Ambulatory Visit (HOSPITAL_COMMUNITY)
Admission: RE | Admit: 2019-10-02 | Discharge: 2019-10-02 | Disposition: A | Discharge status: Home / Self Care | Payer: BC Managed Care – PPO | Source: Ambulatory Visit | Attending: Internal Medicine | Admitting: Internal Medicine

## 2019-10-02 DIAGNOSIS — R0609 Other forms of dyspnea: Secondary | ICD-10-CM | POA: Diagnosis not present

## 2019-10-02 DIAGNOSIS — R7989 Other specified abnormal findings of blood chemistry: Secondary | ICD-10-CM | POA: Diagnosis not present

## 2019-10-02 DIAGNOSIS — Z8616 Personal history of COVID-19: Secondary | ICD-10-CM | POA: Diagnosis not present

## 2019-10-02 DIAGNOSIS — R0600 Dyspnea, unspecified: Secondary | ICD-10-CM

## 2019-10-02 DIAGNOSIS — I340 Nonrheumatic mitral (valve) insufficiency: Secondary | ICD-10-CM | POA: Diagnosis not present

## 2019-10-02 NOTE — Progress Notes (Signed)
No evidence of DVT.

## 2019-10-02 NOTE — Progress Notes (Signed)
Echocardiogram 2D Echocardiogram has been performed.  Julie Flowers 10/02/2019, 10:23 AM

## 2019-10-02 NOTE — Telephone Encounter (Signed)
pls let patien tknow dopples negative

## 2019-10-02 NOTE — Telephone Encounter (Signed)
Spoke with Morrie Sheldon. She stated that she wanted to let MR know that the patient's dopplers were both negative for blood clots.   She went ahead and discharged the patient. Results should be uploaded to Epic.

## 2019-10-02 NOTE — Telephone Encounter (Signed)
I already  sent a separate note on the echo saying that it was normal and we will discuss more at the follow-up visit.

## 2019-10-02 NOTE — Telephone Encounter (Signed)
Spoke with patient. She verbalized understanding of results. She stated that she also had an echo today and would like to know the results of that test as well.   Those results are in epic as well.   MR, please advise. Thanks.

## 2019-10-02 NOTE — Telephone Encounter (Signed)
Called and spoke with pt letting her know the results of the echo and she verbalized understanding. Nothing further needed.

## 2019-10-08 DIAGNOSIS — K5900 Constipation, unspecified: Secondary | ICD-10-CM | POA: Diagnosis not present

## 2019-10-08 DIAGNOSIS — Z1211 Encounter for screening for malignant neoplasm of colon: Secondary | ICD-10-CM | POA: Diagnosis not present

## 2019-10-08 DIAGNOSIS — K625 Hemorrhage of anus and rectum: Secondary | ICD-10-CM | POA: Diagnosis not present

## 2019-10-08 DIAGNOSIS — K219 Gastro-esophageal reflux disease without esophagitis: Secondary | ICD-10-CM | POA: Diagnosis not present

## 2019-10-09 ENCOUNTER — Ambulatory Visit: Payer: BC Managed Care – PPO | Admitting: Internal Medicine

## 2019-10-10 ENCOUNTER — Ambulatory Visit (INDEPENDENT_AMBULATORY_CARE_PROVIDER_SITE_OTHER): Payer: BC Managed Care – PPO | Admitting: Internal Medicine

## 2019-10-10 ENCOUNTER — Other Ambulatory Visit: Payer: Self-pay | Admitting: *Deleted

## 2019-10-10 ENCOUNTER — Other Ambulatory Visit: Payer: Self-pay

## 2019-10-10 DIAGNOSIS — Z8616 Personal history of COVID-19: Secondary | ICD-10-CM

## 2019-10-10 DIAGNOSIS — R0609 Other forms of dyspnea: Secondary | ICD-10-CM

## 2019-10-10 DIAGNOSIS — R0600 Dyspnea, unspecified: Secondary | ICD-10-CM | POA: Diagnosis not present

## 2019-10-10 NOTE — Patient Instructions (Signed)
ICD-10-CM   1. History of 2019 novel coronavirus disease (COVID-19)  Z86.16   2. Dyspnea on exertion  R06.00     Chest x-ray shows significant improvement with some basal residual scarring.  Blood count is stable and baseline.  Echocardiogram and Dopplers are normal.  D-dimer is nearly back to normal.  Plan -Keep physically active and avoid getting respiratory infections and follow general health precautions  Follow-up -January 2022 with chest x-ray for 1 year post Covid follow-up = okay to see nurse practitioner

## 2019-10-10 NOTE — Progress Notes (Signed)
OV 09/26/2019  Subjective:  Patient ID: Julie Flowers, female , DOB: [birth year] , age 64 y.o. , MRN: [id number] , ADDRESS: [address] [place] [address]   09/26/2019 -   Chief Complaint  Patient presents with  . Consult    Pt is being referred by Dr. Tracie Harrier due to SOB post covid. Pt states SOB is with exertion. Pt denies any complaints of cough or chest discomfort.     HPI Julie Flowers 64 y.o. -works in Designer, fashion/clothing and part-time Biomedical scientist.  She has obesity and hypertension.  Developed COVID-19 in January 2021.  Treated at [facility] with standard protocol.  Does not appear to have been ventilated with mechanical ventilation and intubation.  She was hospitalized for 3 days ending June 18, 2019.  She was given prednisone at discharge.  She did not need oxygen.  She says overall her course is 1 of improvement.  She is steadily improving.  However she has significant exertional fatigue and shortness of breath.  Nevertheless she was able to do 3 laps today in our office and did not desaturate.  She is got tachycardic.  She denies any previous sleep apnea or asthma.  Symptom and walk scores are listed below.No wheezzing  Review of records indicates she has not had an echo.  Last chest x-ray was June 15, 2019 at admission with bilateral pulmonary infiltrates.  Personally visualized.   She had acute kidney injury at hospitalization this normalized at discharge to creatinine 0.99 mg percent.  Hemoglobin at discharge 12.6 g%.  CRP at discharge 7.  D-dimer at discharge was elevated at 2.29  9 she denies any current pedal edema  SYMPTOM SCALE - ILD 09/26/2019   O2 use ra  Shortness of Breath 0 -> 5 scale with 5 being worst (score 6 If unable to do)  At rest 0  Simple tasks - showers, clothes change, eating, shaving 0  Household (dishes, doing bed, laundry) 1  Shopping 3  Walking level at own pace 2  Walking up Stairs 3  Total (30-36) Dyspnea Score 9  How bad is  your cough? 0  How bad is your fatigue 0  How bad is nausea 0  How bad is vomiting?  0  How bad is diarrhea? 0  How bad is anxiety? 0  How bad is depression 0        Simple office walk 185 feet x  3 laps goal with forehead probe 09/26/2019   O2 used ra  Number laps completed 3  Comments about pace avg  Resting Pulse Ox/HR 100% and 74/min  Final Pulse Ox/HR 98% and 107/min  Desaturated </= 88% no  Desaturated <= 3% points no  Got Tachycardic >/= 90/min yes  Symptoms at end of test Mild dspnea  Miscellaneous comments x       ROS - per HPI      OV 10/10/2019 telephone visit.  Patient identified to person identifier.  Risks, benefits and limitations of telephone visit explained and patient accepted risk.  Subjective:  Patient ID: Julie Flowers, female , DOB: 1956-04-27 , age 64 y.o. , MRN: [id number] , ADDRESS: [address] [place] [address]   10/10/2019 -post Covid dyspnea.  Here to discuss results.  She is in the car waiting for her Covid vaccine.  She tells me she is actually feeling better.  Her D-dimer is nearly normal but to be on the safe side we did Doppler lower extremity and this  was free of DVT.  We did echocardiogram and this was normal.  She had hemoglobin 12 g% range this seems baseline.  She had a chest x-ray there personally visualized shows significant improvement may be some residual scarring.   HPI Julie Flowers 64 y.o. -    Results for Julie Flowers, Julie Flowers (MRN [id number]) as of 10/10/2019 14:28  Ref. Range 06/15/2019 20:33 06/16/2019 06:50 06/17/2019 03:08 06/18/2019 02:27 09/26/2019 15:39  D-Dimer, Sharene Butters Latest Ref Range: <0.50 mcg/mL FEU 2.34 (H) 2.78 (H) 2.06 (H) 2.29 (H) 0.59 (H)   ROS - per HPI  IMPRESSIONS    1. Left ventricular ejection fraction, by estimation, is 55 to 60%. Left  ventricular ejection fraction by 3D volume is 58 %. The left ventricle has  normal function. The left ventricle has no regional wall motion  abnormalities.  Left ventricular diastolic  parameters were normal. The average left ventricular global longitudinal  strain is -18.0 %. The global longitudinal strain is normal.  2. Right ventricular systolic function is normal. The right ventricular  size is normal. There is normal pulmonary artery systolic pressure. The  estimated right ventricular systolic pressure is 21.1 mmHg.  3. The mitral valve is grossly normal. Mild mitral valve regurgitation.  No evidence of mitral stenosis.  4. The aortic valve is tricuspid. Aortic valve regurgitation is not  visualized. No aortic stenosis is present.  5. The inferior vena cava is normal in size with greater than 50%  respiratory variability, suggesting right atrial pressure of 3 mmHg.   FINDINGS  Left Ventricle: Left ventricular ejection fraction, by estimation, is 55  to 60%. Left ventricular ejection fraction by 3D volume is 58 %. The left  ventricle has normal function. The left ventricle has no regional wall  motion abnormalities. The average  left ventricular global longitudinal strain is -18.0 %. The global  longitudinal strain is normal. The left ventricular internal cavity size  was normal in size. There is no left ventricular hypertrophy. Left  ventricular diastolic parameters were normal.  Normal left ventricular filling pressure.   CXR IMPRESSION: 1. Interstitial prominence consistent with postinflammatory scarring. 2. Complete resolution of previous airspace disease.   Electronically Signed   By: Sharlet Salina M.D.   On: 09/26/2019 22:42   Results for Julie Flowers, Julie Flowers (MRN [id number]) as of 10/10/2019 14:28  Ref. Range 09/26/2019 15:39  Hemoglobin Latest Ref Range: 12.0 - 15.0 g/dL 94.1   Results for Julie Flowers, Julie Flowers (MRN [id number]) as of 10/10/2019 14:28  Ref. Range 06/15/2019 20:33 06/16/2019 06:50 06/17/2019 03:08 06/18/2019 02:27 09/26/2019 15:39  Hemoglobin Latest Ref Range: 12.0 - 15.0 g/dL 85.6 31.4 97.0 (L) 26.3 12.1    has  a past medical history of Anemia, History of uterine fibroid, and Pre-diabetes.   reports that she has never smoked. She has never used smokeless tobacco.  Past Surgical History:  Procedure Laterality Date  . BREAST LUMPECTOMY Right 10+ years ago   Benign  . CYSTECTOMY  10 years ago   wrist   . LAPAROSCOPIC VAGINAL HYSTERECTOMY  15 years ago   due to fibroids    No Known Allergies   There is no immunization history on file for this patient.  Family History  Problem Relation Age of Onset  . Diabetes Mother   . Leukemia Maternal Uncle   . Diabetes Maternal Uncle     No current outpatient medications on file.      Objective:   There were no vitals filed for this visit.  Estimated body  mass index is 36.39 kg/m as calculated from the following:   Height as of 09/26/19: 5' 4.75" (1.645 m).   Weight as of 09/26/19: 217 lb (98.4 kg).  @WEIGHTCHANGE @  There were no vitals filed for this visit.   Physical Exam sounded normal and cheerful on the phone    Assessment:       ICD-10-CM   1. History of 2019 novel coronavirus disease (COVID-19)  Z86.16   2. Dyspnea on exertion  R06.00        Plan:     Patient Instructions     ICD-10-CM   1. History of 2019 novel coronavirus disease (COVID-19)  Z86.16   2. Dyspnea on exertion  R06.00     Chest x-ray shows significant improvement with some basal residual scarring.  Blood count is stable and baseline.  Echocardiogram and Dopplers are normal.  D-dimer is nearly back to normal.  Plan -Keep physically active and avoid getting respiratory infections and follow general health precautions  Follow-up -January 2022 with chest x-ray for 1 year post Covid follow-up = okay to see nurse practitioner     SIGNATURE    Dr. Brand Males, M.D., F.C.C.P,  Pulmonary and Critical Care Medicine Staff Physician, [place] Director - Interstitial Lung Disease  Program  Pulmonary [place] at [place], [place], [address]  Pager: [phone], If no answer or between  15:00h - 7:00h: call [phone] Telephone: [phone]  2:33 PM 10/10/2019

## 2019-11-05 ENCOUNTER — Other Ambulatory Visit: Payer: Self-pay

## 2019-11-05 ENCOUNTER — Ambulatory Visit
Admission: RE | Admit: 2019-11-05 | Discharge: 2019-11-05 | Disposition: A | Discharge status: Home / Self Care | Payer: BC Managed Care – PPO | Source: Ambulatory Visit | Attending: Family Medicine | Admitting: Family Medicine

## 2019-11-05 DIAGNOSIS — Z780 Asymptomatic menopausal state: Secondary | ICD-10-CM | POA: Diagnosis not present

## 2019-11-05 DIAGNOSIS — E2839 Other primary ovarian failure: Secondary | ICD-10-CM

## 2019-11-05 DIAGNOSIS — Z13820 Encounter for screening for osteoporosis: Secondary | ICD-10-CM | POA: Diagnosis not present

## 2019-11-18 DIAGNOSIS — K625 Hemorrhage of anus and rectum: Secondary | ICD-10-CM | POA: Diagnosis not present

## 2019-11-18 DIAGNOSIS — Z1211 Encounter for screening for malignant neoplasm of colon: Secondary | ICD-10-CM | POA: Diagnosis not present

## 2019-11-18 DIAGNOSIS — K635 Polyp of colon: Secondary | ICD-10-CM | POA: Diagnosis not present

## 2019-12-10 DIAGNOSIS — H25813 Combined forms of age-related cataract, bilateral: Secondary | ICD-10-CM | POA: Diagnosis not present

## 2019-12-10 DIAGNOSIS — H5213 Myopia, bilateral: Secondary | ICD-10-CM | POA: Diagnosis not present

## 2019-12-10 DIAGNOSIS — H524 Presbyopia: Secondary | ICD-10-CM | POA: Diagnosis not present

## 2020-06-17 DIAGNOSIS — Z23 Encounter for immunization: Secondary | ICD-10-CM | POA: Diagnosis not present

## 2020-10-14 ENCOUNTER — Encounter: Payer: Self-pay | Admitting: Primary Care

## 2020-10-14 ENCOUNTER — Ambulatory Visit: Payer: BC Managed Care – PPO | Admitting: Primary Care

## 2020-10-14 ENCOUNTER — Other Ambulatory Visit: Payer: Self-pay

## 2020-10-14 ENCOUNTER — Ambulatory Visit (INDEPENDENT_AMBULATORY_CARE_PROVIDER_SITE_OTHER): Payer: BC Managed Care – PPO

## 2020-10-14 VITALS — BP 128/78 | HR 65 | Ht 64.75 in | Wt 229.0 lb

## 2020-10-14 DIAGNOSIS — U071 COVID-19: Secondary | ICD-10-CM

## 2020-10-14 DIAGNOSIS — B948 Sequelae of other specified infectious and parasitic diseases: Secondary | ICD-10-CM | POA: Insufficient documentation

## 2020-10-14 DIAGNOSIS — J1282 Pneumonia due to coronavirus disease 2019: Secondary | ICD-10-CM | POA: Diagnosis not present

## 2020-10-14 DIAGNOSIS — R0609 Other forms of dyspnea: Secondary | ICD-10-CM

## 2020-10-14 DIAGNOSIS — E669 Obesity, unspecified: Secondary | ICD-10-CM | POA: Insufficient documentation

## 2020-10-14 DIAGNOSIS — B1910 Unspecified viral hepatitis B without hepatic coma: Secondary | ICD-10-CM | POA: Insufficient documentation

## 2020-10-14 DIAGNOSIS — Z8616 Personal history of COVID-19: Secondary | ICD-10-CM

## 2020-10-14 DIAGNOSIS — I10 Essential (primary) hypertension: Secondary | ICD-10-CM

## 2020-10-14 DIAGNOSIS — R0600 Dyspnea, unspecified: Secondary | ICD-10-CM | POA: Diagnosis not present

## 2020-10-14 DIAGNOSIS — Z6838 Body mass index (BMI) 38.0-38.9, adult: Secondary | ICD-10-CM

## 2020-10-14 HISTORY — DX: Essential (primary) hypertension: I10

## 2020-10-14 NOTE — Progress Notes (Signed)
@Patient  ID: , female    DOB: 04-13-1956, 65 y.o.   MRN: [id number]  Chief Complaint  Patient presents with  . Follow-up    Post covid    Referring provider: [id number], MD  HPI: 65 year old female, never smoked.  Past medical history significant for COVID-19 pneumonia, acute respiratory failure due to COVID-19, hypertension, obesity.  Patient of Dr. 79, in office on 10/10/2019.  X-ray in May 2021 showed interstitial prominence consistent with postinflammatory scarring.  Complete resolution of previous airspace disease.  Previous [facility] pulmonary encounter: 10/10/19- Dr. 10/12/19 65 y.o. 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  She has obesity and hypertension.  Developed COVID-19 in January 2021.  Treated at [facility] with standard protocol.  Does not appear to have been ventilated with mechanical ventilation and intubation.  She was hospitalized for 3 days ending June 18, 2019.  She was given prednisone at discharge.  She did not need oxygen.  She says overall her course is 1 of improvement.  She is steadily improving.  However she has significant exertional fatigue and shortness of breath.  Nevertheless she was able to do 3 laps today in our office and did not desaturate.  She is got tachycardic.  She denies any previous sleep apnea or asthma.  Symptom and walk scores are listed below.No wheezzing  Review of records indicates she has not had an echo.  Last chest x-ray was June 15, 2019 at admission with bilateral pulmonary infiltrates.  Personally visualized.   She had acute kidney injury at hospitalization this normalized at discharge to creatinine 0.99 mg percent.  Hemoglobin at discharge 12.6 g%.  CRP at discharge 7.  D-dimer at discharge was elevated at 2.29  9 she denies any current pedal edema   10/14/2020 - Interim hx  Patient presents today for 1 year follow-up with repeat chest x-ray imaging. She has no acute  respiratory complaints. She mainly has fatigue and exertional shortness of breath. Her weight has gone up approx 10 lbs in the last year. She is sleeping well at night. She is interested in losing weight and would like [facility]. Denies cough, chest tightness or wheezing.    SYMPTOM SCALE - ILD 09/26/2019  10/14/2020   O2 use ra RA  Shortness of Breath 0 -> 5 scale with 5 being worst (score 6 If unable to do) 0-5  At rest 0 3  Simple tasks - showers, clothes change, eating, shaving 0 1  Household (dishes, doing bed, laundry) 1 5  Shopping 3 1  Walking level at own pace 2 1  Walking up Stairs 3 2  Total (30-36) Dyspnea Score 9 13  How bad is your cough? 0 0  How bad is your fatigue 0 4  How bad is nausea 0 0  How bad is vomiting?  0 0  How bad is diarrhea? 0 0  How bad is anxiety? 0 0  How bad is depression 0 0    No Known Allergies   There is no immunization history on file for this patient.  Past Medical History:  Diagnosis Date  . Anemia   . Essential hypertension 10/14/2020  . History of uterine fibroid   . Pre-diabetes     Tobacco History: Social History   Tobacco Use  Smoking Status Never Smoker  Smokeless Tobacco Never Used   Counseling given: Not Answered   Outpatient Medications Prior to Visit  Medication  Sig Dispense Refill  . Multiple Vitamins-Minerals (ZINC PO) Take by mouth as needed.     No facility-administered medications prior to visit.    Review of Systems  Review of Systems  Constitutional: Positive for fatigue.  HENT: Negative.   Respiratory: Negative.  Negative for cough, chest tightness, shortness of breath and wheezing.        Exertional dyspnea   Cardiovascular: Negative.     Physical Exam  BP 128/78   Pulse 65   Ht 5' 4.75" (1.645 m)   Wt 229 lb (103.9 kg)   LMP 07/24/2003   SpO2 99%   BMI 38.40 kg/m  Physical Exam Constitutional:      Appearance: Normal appearance.   HENT:     Head: Normocephalic and atraumatic.     Mouth/Throat:     Mouth: Mucous membranes are moist.     Pharynx: Oropharynx is clear.  Cardiovascular:     Rate and Rhythm: Normal rate and regular rhythm.     Comments: RRR Pulmonary:     Effort: Pulmonary effort is normal.     Breath sounds: Normal breath sounds.     Comments: CTA Musculoskeletal:        General: Normal range of motion.  Skin:    General: Skin is warm and dry.  Neurological:     General: No focal deficit present.     Mental Status: She is alert and oriented to person, place, and time. Mental status is at baseline.  Psychiatric:        Mood and Affect: Mood normal.        Behavior: Behavior normal.        Thought Content: Thought content normal.        Judgment: Judgment normal.      Lab Results:  CBC    Component Value Date/Time   WBC 6.8 09/26/2019 1539   RBC 4.12 09/26/2019 1539   HGB 12.1 09/26/2019 1539   HCT 35.7 (L) 09/26/2019 1539   PLT 282.0 09/26/2019 1539   MCV 86.6 09/26/2019 1539   MCH 28.7 06/18/2019 0227   MCHC 33.9 09/26/2019 1539   RDW 14.4 09/26/2019 1539   LYMPHSABS 3.0 09/26/2019 1539   MONOABS 0.5 09/26/2019 1539   EOSABS 0.1 09/26/2019 1539   BASOSABS 0.0 09/26/2019 1539    BMET    Component Value Date/Time   NA 141 09/26/2019 1539   K 3.7 09/26/2019 1539   CL 110 09/26/2019 1539   CO2 29 09/26/2019 1539   GLUCOSE 95 09/26/2019 1539   BUN 16 09/26/2019 1539   CREATININE 1.13 09/26/2019 1539   CALCIUM 9.3 09/26/2019 1539   GFRNONAA 60 (L) 06/18/2019 0227   GFRAA >60 06/18/2019 0227    BNP No results found for: BNP  ProBNP    Component Value Date/Time   PROBNP 17.0 09/26/2019 1539    Imaging: DG Chest 2 View  Result Date: 10/14/2020 CLINICAL DATA:  Dyspnea on exertion, history of COVID-19 EXAM: CHEST - 2 VIEW COMPARISON:  09/26/2019 FINDINGS: Normal heart size, mediastinal contours, and pulmonary vascularity. Lungs clear. No pulmonary infiltrate, pleural  effusion, or pneumothorax. Osseous structures unremarkable. IMPRESSION: No acute abnormalities. Electronically Signed   By: Ulyses Southward M.D.   On: 10/14/2020 11:06     Assessment & Plan:   Pneumonia due to COVID-19 virus - Resolved. No acute respiratory complaints. She has some residual fatigue and exertion dyspnea which is likely d/t deconditioning and weight gain. Her exam was normal. CXR  today showed clear lungs, no evidence of scarring or residual pneumonia. If patient continues to have dyspnea symptoms despite working on weight loss would recommend she return with full PFTs.   BMI 38.0-38.9,adult - Her weight is up 10-12 lbs from last year. She is interested in losing weight and would like referral to healthy weight and wellness    Glenford Bayley, NP 10/14/2020

## 2020-10-14 NOTE — Assessment & Plan Note (Signed)
-   Resolved. No acute respiratory complaints. She has some residual fatigue and exertion dyspnea which is likely d/t deconditioning and weight gain. Her exam was normal. CXR today showed clear lungs, no evidence of scarring or residual pneumonia. If patient continues to have dyspnea symptoms despite working on weight loss would recommend she return with full PFTs.

## 2020-10-14 NOTE — Patient Instructions (Addendum)
Referral: Healthy weight and wellness re: BMI 38  Orders: CXR today- already ordered  Follow-up: As needed if shortness of breath does not improve with weight loss

## 2020-10-14 NOTE — Assessment & Plan Note (Addendum)
-   Her weight is up 10-12 lbs from last year. She is interested in losing weight and would like referral to healthy weight and wellness

## 2021-01-12 ENCOUNTER — Ambulatory Visit (INDEPENDENT_AMBULATORY_CARE_PROVIDER_SITE_OTHER): Payer: Self-pay | Admitting: Family Medicine

## 2021-01-12 ENCOUNTER — Other Ambulatory Visit: Payer: Self-pay

## 2021-01-12 ENCOUNTER — Encounter (INDEPENDENT_AMBULATORY_CARE_PROVIDER_SITE_OTHER): Payer: Self-pay | Admitting: Family Medicine

## 2021-01-12 VITALS — BP 154/76 | HR 60 | Temp 97.9°F | Ht 60.0 in | Wt 218.0 lb

## 2021-01-12 DIAGNOSIS — I10 Essential (primary) hypertension: Secondary | ICD-10-CM

## 2021-01-12 DIAGNOSIS — D538 Other specified nutritional anemias: Secondary | ICD-10-CM

## 2021-01-12 DIAGNOSIS — K259 Gastric ulcer, unspecified as acute or chronic, without hemorrhage or perforation: Secondary | ICD-10-CM

## 2021-01-12 DIAGNOSIS — R0602 Shortness of breath: Secondary | ICD-10-CM

## 2021-01-12 DIAGNOSIS — Z6837 Body mass index (BMI) 37.0-37.9, adult: Secondary | ICD-10-CM

## 2021-01-12 DIAGNOSIS — Z0289 Encounter for other administrative examinations: Secondary | ICD-10-CM

## 2021-01-12 DIAGNOSIS — R7303 Prediabetes: Secondary | ICD-10-CM

## 2021-01-12 DIAGNOSIS — R5383 Other fatigue: Secondary | ICD-10-CM

## 2021-01-12 DIAGNOSIS — K5909 Other constipation: Secondary | ICD-10-CM

## 2021-01-12 DIAGNOSIS — Z9189 Other specified personal risk factors, not elsewhere classified: Secondary | ICD-10-CM

## 2021-01-12 DIAGNOSIS — Z1331 Encounter for screening for depression: Secondary | ICD-10-CM

## 2021-01-13 LAB — CBC WITH DIFFERENTIAL/PLATELET
Basophils Absolute: 0 10*3/uL (ref 0.0–0.2)
Basos: 1 %
EOS (ABSOLUTE): 0.1 10*3/uL (ref 0.0–0.4)
Eos: 1 %
Hematocrit: 37.3 % (ref 34.0–46.6)
Hemoglobin: 12.4 g/dL (ref 11.1–15.9)
Immature Grans (Abs): 0 10*3/uL (ref 0.0–0.1)
Immature Granulocytes: 0 %
Lymphocytes Absolute: 2.4 10*3/uL (ref 0.7–3.1)
Lymphs: 42 %
MCH: 28.9 pg (ref 26.6–33.0)
MCHC: 33.2 g/dL (ref 31.5–35.7)
MCV: 87 fL (ref 79–97)
Monocytes Absolute: 0.4 10*3/uL (ref 0.1–0.9)
Monocytes: 8 %
Neutrophils Absolute: 2.9 10*3/uL (ref 1.4–7.0)
Neutrophils: 48 %
Platelets: 291 10*3/uL (ref 150–450)
RBC: 4.29 x10E6/uL (ref 3.77–5.28)
RDW: 12.9 % (ref 11.7–15.4)
WBC: 5.9 10*3/uL (ref 3.4–10.8)

## 2021-01-13 LAB — LIPID PANEL WITH LDL/HDL RATIO
Cholesterol, Total: 216 mg/dL — ABNORMAL HIGH (ref 100–199)
HDL: 68 mg/dL (ref >39)
LDL Chol Calc (NIH): 129 mg/dL — ABNORMAL HIGH (ref 0–99)
LDL/HDL Ratio: 1.9 ratio (ref 0.0–3.2)
Triglycerides: 108 mg/dL (ref 0–149)
VLDL Cholesterol Cal: 19 mg/dL (ref 5–40)

## 2021-01-13 LAB — COMPREHENSIVE METABOLIC PANEL
ALT: 10 IU/L (ref 0–32)
AST: 20 IU/L (ref 0–40)
Albumin/Globulin Ratio: 1.3 (ref 1.2–2.2)
Albumin: 4.2 g/dL (ref 3.8–4.8)
Alkaline Phosphatase: 90 IU/L (ref 44–121)
BUN/Creatinine Ratio: 11 — ABNORMAL LOW (ref 12–28)
BUN: 12 mg/dL (ref 8–27)
Bilirubin Total: 0.4 mg/dL (ref 0.0–1.2)
CO2: 24 mmol/L (ref 20–29)
Calcium: 9.7 mg/dL (ref 8.7–10.3)
Chloride: 105 mmol/L (ref 96–106)
Creatinine, Ser: 1.05 mg/dL — ABNORMAL HIGH (ref 0.57–1.00)
Globulin, Total: 3.2 g/dL (ref 1.5–4.5)
Glucose: 95 mg/dL (ref 65–99)
Potassium: 4.3 mmol/L (ref 3.5–5.2)
Sodium: 142 mmol/L (ref 134–144)
Total Protein: 7.4 g/dL (ref 6.0–8.5)
eGFR: 58 mL/min/{1.73_m2} — ABNORMAL LOW (ref >59)

## 2021-01-13 LAB — HEMOGLOBIN A1C
Est. average glucose Bld gHb Est-mCnc: 134 mg/dL
Hgb A1c MFr Bld: 6.3 % — ABNORMAL HIGH (ref 4.8–5.6)

## 2021-01-13 LAB — INSULIN, RANDOM: INSULIN: 7.8 u[IU]/mL (ref 2.6–24.9)

## 2021-01-13 LAB — FOLATE: Folate: 11.6 ng/mL (ref >3.0)

## 2021-01-13 LAB — T4, FREE: Free T4: 1.27 ng/dL (ref 0.82–1.77)

## 2021-01-13 LAB — T3: T3, Total: 131 ng/dL (ref 71–180)

## 2021-01-13 LAB — TSH: TSH: 2.25 u[IU]/mL (ref 0.450–4.500)

## 2021-01-13 LAB — VITAMIN D 25 HYDROXY (VIT D DEFICIENCY, FRACTURES): Vit D, 25-Hydroxy: 15.4 ng/mL — ABNORMAL LOW (ref 30.0–100.0)

## 2021-01-13 LAB — VITAMIN B12: Vitamin B-12: 843 pg/mL (ref 232–1245)

## 2021-01-13 NOTE — Progress Notes (Signed)
Dear Ames DuraElizabeth Walsh, NP,   Thank you for referring Julie Flowers to our clinic. The following note includes my evaluation and treatment recommendations.  Chief Complaint:   OBESITY Julie Flowers (MR# [id number]) is a 65 y.o. female who presents for evaluation and treatment of obesity and related comorbidities. Current BMI is Body mass index is 42.58 kg/m. Julie Flowers has been struggling with her weight for many years and has been unsuccessful in either losing weight, maintaining weight loss, or reaching her healthy weight goal.  Julie Flowers is currently in the action stage of change and ready to dedicate time achieving and maintaining a healthier weight. Julie Flowers is interested in becoming our patient and working on intensive lifestyle modifications including (but not limited to) diet and exercise for weight loss.  Julie Flowers retired recently from [employer]. 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 She is single and live alone. She has 1 child. Craves sweets like ice cream, candy, and cookies. She skips all meals. Pt drinks a lot of caloric beverages. 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  Julie Flowers's habits were reviewed today and are as follows: her desired weight loss is 78 lbs, she started gaining weight about 10 years ago, her heaviest weight ever was 218 pounds, she is a picky eater and doesn't like to eat healthier foods, she has significant food cravings issues, she skips meals frequently, she is frequently drinking liquids with calories, she frequently makes poor food choices, she has problems with excessive hunger, she frequently eats larger portions than normal, she has binge eating behaviors, and she struggles with emotional eating.  Depression Screen Julie Flowers's Food and Mood (modified PHQ-9) score was 8.  Depression screen Julie Flowers 2/9 01/12/2021  Decreased Interest 1  Down, Depressed, Hopeless 3  PHQ - 2 Score 4  Altered sleeping 1  Tired, decreased energy 2  Change in  appetite 0  Feeling bad or failure about yourself  1  Trouble concentrating 0  Moving slowly or fidgety/restless 0  Suicidal thoughts 0  PHQ-9 Score 8  Difficult doing work/chores Somewhat difficult   Subjective:   1. Other fatigue Julie Flowers admits to daytime somnolence and admits to waking up still tired. Patent has a history of symptoms of daytime fatigue, morning fatigue, and morning headache. Julie Flowers generally gets 5 or 6 hours of sleep per night, and states that she has generally restful sleep. Snoring is not present. Apneic episodes are present. Epworth Sleepiness Score is 13. Pt denies OSA or concerns she has it. She works a lot and doesn't make sleep a priority for herself.  2. Shortness of breath on exertion Julie Flowers notes increasing shortness of breath with exercising and seems to be worsening over time with weight gain. She notes getting out of breath sooner with activity than she used to. This has gotten worse recently. Julie Flowers denies shortness of breath at rest or orthopnea.  3. Essential hypertension Not at goal. Medications: None.  Julie Flowers is nervous today.    BP Readings from Last 3 Encounters:  01/12/21 (!) 154/76  10/14/20 128/78  09/26/19 120/78   Lab Results  Component Value Date   CREATININE 1.05 (H) 01/12/2021   4. Pre-diabetes Diagnosed a "couple of years ago." Not at goal. Goal is HgbA1c < 5.7.  Medication: None.    Lab Results  Component Value Date   HGBA1C 6.3 (H) 01/12/2021   Lab Results  Component Value Date   INSULIN 7.8 01/12/2021   5. Other specified  nutritional anemias Julie Flowers is not a vegetarian.  She does not have a history of weight loss surgery.  She is not on meds or supplementation.  CBC Latest Ref Rng & Units 01/12/2021 09/26/2019 06/18/2019  WBC 3.4 - 10.8 x10E3/uL 5.9 6.8 15.4(H)  Hemoglobin 11.1 - 15.9 g/dL 41.6 60.6 30.1  Hematocrit 34.0 - 46.6 % 37.3 35.7(L) 37.5  Platelets 150 - 450 x10E3/uL 291 282.0 401(H)   Lab Results  Component Value  Date   FERRITIN 310 (H) 06/18/2019   Lab Results  Component Value Date   VITAMINB12 843 01/12/2021   6. Gastric ulcer, unspecified chronicity, unspecified whether gastric ulcer hemorrhage or perforation present Pt has history of GERD. Not an acute concern for her. No medications or supplements.  7. Other constipation This problem is stable. She is not on medication or supplements. Julie Flowers was informed that a decrease in bowel movement frequency is normal while losing weight, but stools should not be hard or painful.    Assessment/Plan:   Orders Placed This Encounter  Procedures   Vitamin B12   CBC with Differential/Platelet   Comprehensive metabolic panel   Folate   Hemoglobin A1c   Insulin, random   Lipid Panel With LDL/HDL Ratio   T3   T4, free   TSH   VITAMIN D 25 Hydroxy (Vit-D Deficiency, Fractures)   EKG 12-Lead    Medications Discontinued During This Encounter  Medication Reason   Multiple Vitamins-Minerals (ZINC PO) Error     No orders of the defined types were placed in this encounter.    1. Other fatigue Julie Flowers does feel that her weight is causing her energy to be lower than it should be. Fatigue may be related to obesity, depression or many other causes. Labs will be ordered, and in the meanwhile, Julie Flowers will focus on self care including making healthy food choices, increasing physical activity and focusing on stress reduction. Check labs today.  - Vitamin B12 - CBC with Differential/Platelet - Comprehensive metabolic panel - EKG 12-Lead - Folate - Hemoglobin A1c - Insulin, random - Lipid Panel With LDL/HDL Ratio - T3 - T4, free - TSH - VITAMIN D 25 Hydroxy (Vit-D Deficiency, Fractures)  2. Shortness of breath on exertion Julie Flowers does feel that she gets out of breath more easily that she used to when she exercises. Julie Flowers's shortness of breath appears to be obesity related and exercise induced. She has agreed to work on weight loss and gradually increase  exercise to treat her exercise induced shortness of breath. Will continue to monitor closely.  3. Essential hypertension No history of hypertension, per pt. Avoid buying foods that are: processed, frozen, or prepackaged to avoid excess salt. We will continue to monitor closely alongside her PCP and/or Specialist.  Regular follow up with PCP and specialists was also encouraged.   4. Pre-diabetes She will continue to focus on protein-rich, low simple carbohydrate foods. We reviewed the importance of hydration, regular exercise for stress reduction, and restorative sleep.   5. Other specified nutritional anemias Orders and follow up as documented in patient record. Check labs today.  Counseling Iron is essential for our bodies to make red blood cells.  Reasons that someone may be deficient include: an iron-deficient diet (more likely in those following vegan or vegetarian diets), women with heavy menses, patients with GI disorders or poor absorption, patients that have had bariatric surgery, frequent blood donors, patients with cancer, and patients with heart disease.   An iron supplement has been  recommended. This is found over-the-counter.   Iron-rich foods include dark leafy greens, red and white meats, eggs, seafood, and beans.   Certain foods and drinks prevent your body from absorbing iron properly. Avoid eating these foods in the same meal as iron-rich foods or with iron supplements. These foods include: coffee, black tea, and red wine; milk, dairy products, and foods that are high in calcium; beans and soybeans; whole grains.  Constipation can be a side effect of iron supplementation. Increased water and fiber intake are helpful. Water goal: > 2 liters/day. Fiber goal: > 25 grams/day.  6. Gastric ulcer, unspecified chronicity, unspecified whether gastric ulcer hemorrhage or perforation present Follow up closely.   7. Other constipation Will follow closely. Counseling: Getting to Good Bowel  Health: Your goal is to have one soft bowel movement each day. Drink at least 8 glasses of water each day. Eat plenty of fiber (goal is over 30 grams each day). It is best to get most of your fiber from dietary sources which includes leafy green vegetables, fresh fruit, and whole grains. You may need to add fiber with the help of OTC fiber supplements. These include Metamucil, Citrucel, and Benefiber. If you are still having trouble, try adding an osmotic laxative such as Miralax. If all of these changes do not work, Dietitian.   8. Screening for depression Julie Flowers had a positive depression screening. Depression is commonly associated with obesity and often results in emotional eating behaviors. We will monitor this closely and work on CBT to help improve the non-hunger eating patterns. Referral to Psychology may be required if no improvement is seen as she continues in our clinic.  9. At risk for diabetes mellitus - Julie Flowers was given diabetes prevention education and counseling today of more than 23 minutes.  - Counseled patient on pathophysiology of disease and meaning/ implication of lab results.  - Reviewed how certain foods can either stimulate or inhibit insulin release, and subsequently affect hunger pathways  - Importance of following a healthy meal plan with limiting amounts of simple carbohydrates discussed with patient - Effects of regular aerobic exercise on blood sugar regulation reviewed and encouraged an eventual goal of 30 min 5d/week or more as a minimum.  - Briefly discussed treatment options, which always include dietary and lifestyle modification as first line.   - Handouts provided at patient's desire and/or told to go online to the American Diabetes Association website for further information.  10. Obesity with current BMI of 37.6  Julie Flowers is currently in the action stage of change and her goal is to continue with weight loss efforts. I recommend Julie Flowers begin the  structured treatment plan as follows:  She has agreed to the Category 1 Plan.  Exercise goals:  As is    Behavioral modification strategies: increasing lean protein intake, no skipping meals, and planning for success.  She was informed of the importance of frequent follow-up visits to maximize her success with intensive lifestyle modifications for her multiple health conditions. She was informed we would discuss her lab results at her next visit unless there is a critical issue that needs to be addressed sooner. Julie Flowers agreed to keep her next visit at the agreed upon time to discuss these results.  Objective:   Blood pressure (!) 154/76, pulse 60, temperature 97.9 F (36.6 C), height 5' (1.524 m), weight 218 lb (98.9 kg), last menstrual period 07/24/2003, SpO2 98 %. Body mass index is 42.58 kg/m.  EKG: Normal sinus rhythm,  rate 61.  Indirect Calorimeter completed today shows a VO2 of 198 and a REE of 1368.  Her calculated basal metabolic rate is 3903 thus her basal metabolic rate is worse than expected.  General: Cooperative, alert, well developed, in no acute distress. HEENT: Conjunctivae and lids unremarkable. Cardiovascular: Regular rhythm.  Lungs: Normal work of breathing. Neurologic: No focal deficits.   Lab Results  Component Value Date   CREATININE 1.05 (H) 01/12/2021   BUN 12 01/12/2021   NA 142 01/12/2021   K 4.3 01/12/2021   CL 105 01/12/2021   CO2 24 01/12/2021   Lab Results  Component Value Date   ALT 10 01/12/2021   AST 20 01/12/2021   ALKPHOS 90 01/12/2021   BILITOT 0.4 01/12/2021   Lab Results  Component Value Date   HGBA1C 6.3 (H) 01/12/2021   Lab Results  Component Value Date   INSULIN 7.8 01/12/2021   Lab Results  Component Value Date   TSH 2.250 01/12/2021   Lab Results  Component Value Date   CHOL 216 (H) 01/12/2021   HDL 68 01/12/2021   LDLCALC 129 (H) 01/12/2021   TRIG 108 01/12/2021   Lab Results  Component Value Date   WBC 5.9  01/12/2021   HGB 12.4 01/12/2021   HCT 37.3 01/12/2021   MCV 87 01/12/2021   PLT 291 01/12/2021   Lab Results  Component Value Date   FERRITIN 310 (H) 06/18/2019   Attestation Statements:   Reviewed by clinician on day of visit: allergies, medications, problem list, medical history, surgical history, family history, social history, and previous encounter notes.  Edmund Hilda, CMA, am acting as transcriptionist for [employer], DO.  I have reviewed the above documentation for accuracy and completeness, and I agree with the above. Carlye Grippe, D.O.  The 21st Century Cures Act was signed into law in 2016 which includes the topic of electronic health records.  This provides immediate access to information in MyChart.  This includes consultation notes, operative notes, office notes, lab results and pathology reports.  If you have any questions about what you read please let us know at your next visit so we can discuss your concerns and take corrective action if need be.  We are right here with you.

## 2021-01-26 ENCOUNTER — Ambulatory Visit (INDEPENDENT_AMBULATORY_CARE_PROVIDER_SITE_OTHER): Payer: Self-pay | Admitting: Family Medicine

## 2021-01-26 ENCOUNTER — Encounter (INDEPENDENT_AMBULATORY_CARE_PROVIDER_SITE_OTHER): Payer: Self-pay | Admitting: Family Medicine

## 2021-01-26 ENCOUNTER — Ambulatory Visit (INDEPENDENT_AMBULATORY_CARE_PROVIDER_SITE_OTHER): Payer: BLUE CROSS/BLUE SHIELD | Admitting: Family Medicine

## 2021-01-26 ENCOUNTER — Other Ambulatory Visit: Payer: Self-pay

## 2021-01-26 VITALS — BP 135/72 | HR 60 | Temp 97.6°F | Ht 60.0 in | Wt 216.0 lb

## 2021-01-26 DIAGNOSIS — E7849 Other hyperlipidemia: Secondary | ICD-10-CM

## 2021-01-26 DIAGNOSIS — D649 Anemia, unspecified: Secondary | ICD-10-CM

## 2021-01-26 DIAGNOSIS — Z9189 Other specified personal risk factors, not elsewhere classified: Secondary | ICD-10-CM

## 2021-01-26 DIAGNOSIS — E559 Vitamin D deficiency, unspecified: Secondary | ICD-10-CM

## 2021-01-26 DIAGNOSIS — Z6841 Body Mass Index (BMI) 40.0 and over, adult: Secondary | ICD-10-CM

## 2021-01-26 DIAGNOSIS — R7303 Prediabetes: Secondary | ICD-10-CM

## 2021-01-26 DIAGNOSIS — I10 Essential (primary) hypertension: Secondary | ICD-10-CM

## 2021-01-26 MED ORDER — METFORMIN HCL 500 MG PO TABS: ORAL_TABLET | ORAL | 0 refills | Status: DC

## 2021-01-26 MED ORDER — VITAMIN D (ERGOCALCIFEROL) 1.25 MG (50000 UNIT) PO CAPS: 50000.0000 [IU] | ORAL_CAPSULE | ORAL | 0 refills | Status: DC

## 2021-01-26 NOTE — Patient Instructions (Signed)
The 10-year ASCVD risk score Denman George DC Montez Hageman., et al., 2013) is: 10.9%   Values used to calculate the score:     Age: 65 years     Sex: Female     Is Non-Hispanic African American: Yes     Diabetic: No     Tobacco smoker: No     Systolic Blood Pressure: 135 mmHg     Is BP treated: No     HDL Cholesterol: 68 mg/dL     Total Cholesterol: 216 mg/dL

## 2021-01-27 NOTE — Progress Notes (Signed)
Chief Complaint:   OBESITY Julie Flowers is here to discuss her progress with her obesity treatment plan along with follow-up of her obesity related diagnoses. Julie Flowers is on the Category 1 Plan and states she is following her eating plan approximately 0% of the time. Julie Flowers states she is doing 0 minutes 0 times per week.  Today's visit was #: 2 Starting weight: 218 lbs Starting date: 01/12/2021 Today's weight: 216 lbs Today's date: 01/26/2021 Total lbs lost to date: 2 Total lbs lost since last in-office visit: 2  Interim History: Julie Flowers is here today for her first follow-up office visit since starting the program with [place].  All blood work/ lab tests that were recently ordered by myself or an outside provider were reviewed with patient today per their request. Extended time was spent counseling her on all new disease processes that were discovered or preexisting ones that are affected by BMI.  she understands that many of these abnormalities will need to monitored regularly along with the current treatment plan of prudent dietary changes, in which we are making each and every office visit, to improve these health parameters. Julie Flowers's sister's husband recently passed away and she didn't eat at times and she was in the hospital a lot. However, she ate on the plan for 3 days but she was hungry. She was not weighing her protein at all or measuring her veggies.  Subjective:   1. Pre-diabetes Julie Flowers doesn't eat enough protein at all and hence she can have occasional sweets or carbohydrate cravings. She is not on medications. I discussed labs with the patient today.  2. Essential hypertension Julie Flowers's blood pressure is better controlled than at her last office visit. Her blood pressure is diet controlled per the patient. I discussed labs with the patient today.  3. Vitamin D deficiency Julie Flowers has a new diagnosis of Vit D deficiency. She denies a history of low Vit D, and she is not on supplementation. I  discussed labs with the patient today.  4. Other hyperlipidemia Julie Flowers has a new diagnosis of hyperlipidemia. She is not on medications currently. I discussed labs with the patient today.  5. Anemia, unspecified type Julie Flowers is not a vegetarian.  She does not have a history of weight loss surgery. I discussed labs with the patient today.  6. At risk for diabetes mellitus Julie Flowers is at higher than average risk for developing diabetes due to pre-diabetes.   Assessment/Plan:  No orders of the defined types were placed in this encounter.   There are no discontinued medications.   Meds ordered this encounter  Medications   metFORMIN (GLUCOPHAGE) 500 MG tablet    Sig: 1 po with lunch QD    Dispense:  30 tablet    Refill:  0    30 d supply; ov for RF   Vitamin D, Ergocalciferol, (DRISDOL) 1.25 MG (50000 UNIT) CAPS capsule    Sig: Take 1 capsule (50,000 Units total) by mouth every 7 (seven) days.    Dispense:  4 capsule    Refill:  0    Ov for RF; 30 d supply     1. Pre-diabetes Julie Flowers agreed to start metformin 500 mg tablet at lunch q daily with no refills. Handout was given to the patient today on Pre-diabetes and metformin.  - I reiterated and again counseled patient on pathophysiology of the disease process of Pre-DM.   - Stressed importance of dietary and lifestyle modifications resulting in weight loss as first line  txmnt - in addition we discussed the risks and benefits of various medication options which can help [place] in the management of this disease process as well as with weight loss.  Will consider starting one of these meds in future as will focus on prudent nutritional plan at this time.  - continue to decrease simple carbs; increase fiber and proteins -> follow meal plan  - handouts provided at pt's request after education provided.  All concerns/questions addressed.   - anticipatory guidance given.   - Recheck A1c and fasting insulin level in approximately 3 months from last  check or as deemed fit.   - metFORMIN (GLUCOPHAGE) 500 MG tablet; 1 po with lunch QD  Dispense: 30 tablet; Refill: 0  2. Essential hypertension Shamaria's blood pressure is controlled today. She will continue to follow up as directed.  - Counseled Julie Flowers on pathophysiology of disease and discussed treatment plan, which always includes dietary and lifestyle modification as first line.  - Lifestyle changes such as following our low salt, heart healthy meal plan and engaging in a regular exercise program discussed  - Avoid buying foods that are: processed, frozen, or prepackaged to avoid excess salt. - Ambulatory blood pressure monitoring encouraged.  Reminded patient that if they ever feel poorly in any way, to check their blood pressure and pulse as well. - We will continue to monitor closely alongside PCP/ specialists.  Pt reminded to also f/up with those individuals as instructed by them.  - We will continue to monitor symptoms as they relate to the her weight loss journey.  3. Vitamin D deficiency Julie Flowers agreed to start prescription Vit D 50,000 IU weekly with no refills.  - Discussed importance of vitamin D to their health and well-being.  - possible symptoms of low Vitamin D can be low energy, depressed mood, muscle aches, joint aches, osteoporosis etc. - low Vitamin D levels may be linked to an increased risk of cardiovascular events and even increased risk of cancers- such as colon and breast.  - I recommend pt take a 50,000 IU weekly prescription vit D - see script below   - Informed patient this may be a lifelong thing, and she was encouraged to continue to take the medicine until told otherwise.   - we will need to monitor levels regularly (every 3-4 mo on average) to keep levels within normal limits.  - weight loss will likely improve availability of vitamin D, thus encouraged Julie Flowers to continue with meal plan and their weight loss efforts to further improve this condition - pt's  questions and concerns regarding this condition addressed.  - Vitamin D, Ergocalciferol, (DRISDOL) 1.25 MG (50000 UNIT) CAPS capsule; Take 1 capsule (50,000 Units total) by mouth every 7 (seven) days.  Dispense: 4 capsule; Refill: 0  4. Other hyperlipidemia Julie Flowers's ASCVD risk score is > 10%. I advised the patient to decrease saturated and trans fats by following the meal plan. She is to make a follow up appointment with her primary care physician regarding possible medication management.  Julie Flowers reports compliance with meds and/or treatment plan such as low saturated and trans fat low cholesterol meal plan - Rec: aerobic activity with eventual goal of a minimum of 150+ min wk plus 2 days/ week of resistance strength training - Cardiovascular risk and specific lipid/LDL goals reviewed.  We discussed several lifestyle modifications today and Julie Flowers will continue to work on diet, exercise and weight loss efforts.  - Will continue routine screening as patient  continues with health goals and weight loss journey  5. Anemia, unspecified type Julie Flowers denies issues currently, and labs were within normal limits. She will continue her prudent nutritional plan.  6. At risk for diabetes mellitus - Julie Flowers was given diabetes prevention education and counseling today of more than 23 minutes.  - Counseled patient on pathophysiology of disease and meaning/ implication of lab results.  - Reviewed how certain foods can either stimulate or inhibit insulin release, and subsequently affect hunger pathways  - Importance of following a healthy meal plan with limiting amounts of simple carbohydrates discussed with patient - Effects of regular aerobic exercise on blood sugar regulation reviewed and encouraged an eventual goal of 30 min 5d/week or more as a minimum.  - Briefly discussed treatment options, which always include dietary and lifestyle modification as first line.   - Handouts provided at patient's desire  and/or told to go online to the American Diabetes Association website for further information.  7. Obesity with current BMI of 42.2 Julie Flowers is currently in the action stage of change. As such, her goal is to continue with weight loss efforts. She has agreed to the Category 1 Plan.   Exercise goals: As is.  Behavioral modification strategies: increasing lean protein intake, decreasing simple carbohydrates, meal planning and cooking strategies, and keeping healthy foods in the home.  Julie Flowers has agreed to follow-up with our clinic in 2 to 3 weeks. She was informed of the importance of frequent follow-up visits to maximize her success with intensive lifestyle modifications for her multiple health conditions.   Objective:   Blood pressure 135/72, pulse 60, temperature 97.6 F (36.4 C), height 5' (1.524 m), weight 216 lb (98 kg), last menstrual period 07/24/2003, SpO2 99 %. Body mass index is 42.18 kg/m.  General: Cooperative, alert, well developed, in no acute distress. HEENT: Conjunctivae and lids unremarkable. Cardiovascular: Regular rhythm.  Lungs: Normal work of breathing. Neurologic: No focal deficits.   Lab Results  Component Value Date   CREATININE 1.05 (H) 01/12/2021   BUN 12 01/12/2021   NA 142 01/12/2021   K 4.3 01/12/2021   CL 105 01/12/2021   CO2 24 01/12/2021   Lab Results  Component Value Date   ALT 10 01/12/2021   AST 20 01/12/2021   ALKPHOS 90 01/12/2021   BILITOT 0.4 01/12/2021   Lab Results  Component Value Date   HGBA1C 6.3 (H) 01/12/2021   Lab Results  Component Value Date   INSULIN 7.8 01/12/2021   Lab Results  Component Value Date   TSH 2.250 01/12/2021   Lab Results  Component Value Date   CHOL 216 (H) 01/12/2021   HDL 68 01/12/2021   LDLCALC 129 (H) 01/12/2021   TRIG 108 01/12/2021   Lab Results  Component Value Date   VD25OH 15.4 (L) 01/12/2021   Lab Results  Component Value Date   WBC 5.9 01/12/2021   HGB 12.4 01/12/2021   HCT 37.3  01/12/2021   MCV 87 01/12/2021   PLT 291 01/12/2021   Lab Results  Component Value Date   FERRITIN 310 (H) 06/18/2019   Attestation Statements:   Reviewed by clinician on day of visit: allergies, medications, problem list, medical history, surgical history, family history, social history, and previous encounter notes.   Trude Mcburney, am acting as transcriptionist for [employer], DO.  I have reviewed the above documentation for accuracy and completeness, and I agree with the above. Carlye Grippe, D.O.  The 21st  Century Cures Act was signed into law in 2016 which includes the topic of electronic health records.  This provides immediate access to information in MyChart.  This includes consultation notes, operative notes, office notes, lab results and pathology reports.  If you have any questions about what you read please let us know at your next visit so we can discuss your concerns and take corrective action if need be.  We are right here with you.

## 2021-02-09 ENCOUNTER — Ambulatory Visit (INDEPENDENT_AMBULATORY_CARE_PROVIDER_SITE_OTHER): Payer: BLUE CROSS/BLUE SHIELD | Admitting: Family Medicine

## 2021-02-09 ENCOUNTER — Encounter (INDEPENDENT_AMBULATORY_CARE_PROVIDER_SITE_OTHER): Payer: Self-pay

## 2021-02-09 DIAGNOSIS — Z5329 Procedure and treatment not carried out because of patient's decision for other reasons: Secondary | ICD-10-CM

## 2021-02-23 ENCOUNTER — Encounter (INDEPENDENT_AMBULATORY_CARE_PROVIDER_SITE_OTHER): Payer: Self-pay | Admitting: Family Medicine

## 2021-02-23 ENCOUNTER — Ambulatory Visit (INDEPENDENT_AMBULATORY_CARE_PROVIDER_SITE_OTHER): Payer: Medicare Other | Admitting: Family Medicine

## 2021-02-23 ENCOUNTER — Other Ambulatory Visit: Payer: Self-pay

## 2021-02-23 VITALS — BP 137/78 | HR 63 | Temp 97.6°F | Ht 60.0 in | Wt 207.0 lb

## 2021-02-23 DIAGNOSIS — Z6841 Body Mass Index (BMI) 40.0 and over, adult: Secondary | ICD-10-CM

## 2021-02-23 DIAGNOSIS — E559 Vitamin D deficiency, unspecified: Secondary | ICD-10-CM

## 2021-02-23 DIAGNOSIS — R7303 Prediabetes: Secondary | ICD-10-CM | POA: Diagnosis not present

## 2021-02-23 MED ORDER — METFORMIN HCL 500 MG PO TABS: ORAL_TABLET | ORAL | 0 refills | Status: DC

## 2021-02-23 MED ORDER — VITAMIN D (ERGOCALCIFEROL) 1.25 MG (50000 UNIT) PO CAPS: 50000.0000 [IU] | ORAL_CAPSULE | ORAL | 0 refills | Status: DC

## 2021-02-24 NOTE — Progress Notes (Signed)
Chief Complaint:   OBESITY Julie Flowers is here to discuss her progress with her obesity treatment plan along with follow-up of her obesity related diagnoses. Julie Flowers is on the Category 1 Plan and states she is following her eating plan approximately 50% of the time. Julie Flowers states she is walking 60 minutes 7 times per week.  Today's visit was #: 3 Starting weight: 218 lbs Starting date: 01/12/2021 Today's weight: 207 lbs Today's date: 02/23/2021 Total lbs lost to date: 11 Total lbs lost since last in-office visit: 9  Interim History: Julie Flowers's last OV was a month ago. Julie Flowers is here for a follow up office visit.  We reviewed her meal plan and questions were answered.  Patient's food recall appears to be accurate and consistent with what is on plan when she is following it.   When eating on plan, her hunger and cravings are well controlled.   Pt's water intake is 6 bottles a day. 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  Subjective:   1. Pre-diabetes Julie Flowers started Metformin last OV and is tolerating it well. She reports it is working very well with decreasing sweets cravings.  2. Vitamin D deficiency Pt start prescription Vit D at last OV and denies issues or concerns. Julie Flowers is tolerating medication(s) well without side effects.  Medication compliance is good and patient appears to be taking it as prescribed.  Denies additional concerns regarding this condition.   Assessment/Plan:  No orders of the defined types were placed in this encounter.   Medications Discontinued During This Encounter  Medication Reason   metFORMIN (GLUCOPHAGE) 500 MG tablet Reorder   Vitamin D, Ergocalciferol, (DRISDOL) 1.25 MG (50000 UNIT) CAPS capsule Reorder     Meds ordered this encounter  Medications   metFORMIN (GLUCOPHAGE) 500 MG tablet    Sig: 1 po with lunch QD    Dispense:  30 tablet    Refill:  0    30 d supply; ov for RF   Vitamin D,  Ergocalciferol, (DRISDOL) 1.25 MG (50000 UNIT) CAPS capsule    Sig: Take 1 capsule (50,000 Units total) by mouth every 7 (seven) days.    Dispense:  4 capsule    Refill:  0    Ov for RF; 30 d supply     1. Pre-diabetes Julie Flowers will continue Metformin as directed and continue to work on weight loss, exercise, and decreasing simple carbohydrates to help decrease the risk of diabetes.   Refill- metFORMIN (GLUCOPHAGE) 500 MG tablet; 1 po with lunch QD  Dispense: 30 tablet; Refill: 0  2. Vitamin D deficiency Low Vitamin D level contributes to fatigue and are associated with obesity, breast, and colon cancer. She agrees to continue to take prescription Vitamin D 50,000 IU every week and will follow-up for routine testing of Vitamin D, at least 2-3 times per year to avoid over-replacement.  Refill- Vitamin D, Ergocalciferol, (DRISDOL) 1.25 MG (50000 UNIT) CAPS capsule; Take 1 capsule (50,000 Units total) by mouth every 7 (seven) days.  Dispense: 4 capsule; Refill: 0  3. Obesity with current BMI of 40.5  Julie Flowers is currently in the action stage of change. As such, her goal is to continue with weight loss efforts. She has agreed to the Category 1 Plan.   Exercise goals:  As is  Behavioral modification strategies: increasing lean protein intake, decreasing simple carbohydrates, no skipping meals, and planning for success.  Julie Flowers has  agreed to follow-up with our clinic in 2-3 weeks. She was informed of the importance of frequent follow-up visits to maximize her success with intensive lifestyle modifications for her multiple health conditions.   Objective:   Blood pressure 137/78, pulse 63, temperature 97.6 F (36.4 C), height 5' (1.524 m), weight 207 lb (93.9 kg), last menstrual period 07/24/2003, SpO2 96 %. Body mass index is 40.43 kg/m.  General: Cooperative, alert, well developed, in no acute distress. HEENT: Conjunctivae and lids unremarkable. Cardiovascular: Regular rhythm.  Lungs: Normal  work of breathing. Neurologic: No focal deficits.   Lab Results  Component Value Date   CREATININE 1.05 (H) 01/12/2021   BUN 12 01/12/2021   NA 142 01/12/2021   K 4.3 01/12/2021   CL 105 01/12/2021   CO2 24 01/12/2021   Lab Results  Component Value Date   ALT 10 01/12/2021   AST 20 01/12/2021   ALKPHOS 90 01/12/2021   BILITOT 0.4 01/12/2021   Lab Results  Component Value Date   HGBA1C 6.3 (H) 01/12/2021   Lab Results  Component Value Date   INSULIN 7.8 01/12/2021   Lab Results  Component Value Date   TSH 2.250 01/12/2021   Lab Results  Component Value Date   CHOL 216 (H) 01/12/2021   HDL 68 01/12/2021   LDLCALC 129 (H) 01/12/2021   TRIG 108 01/12/2021   Lab Results  Component Value Date   VD25OH 15.4 (L) 01/12/2021   Lab Results  Component Value Date   WBC 5.9 01/12/2021   HGB 12.4 01/12/2021   HCT 37.3 01/12/2021   MCV 87 01/12/2021   PLT 291 01/12/2021   Lab Results  Component Value Date   FERRITIN 310 (H) 06/18/2019    Obesity Behavioral Intervention:   Approximately 15 minutes were spent on the discussion below.  ASK: We discussed the diagnosis of obesity with [name] today and Julie Flowers agreed to give [place] permission to discuss obesity behavioral modification therapy today.  ASSESS: Julie Flowers has the diagnosis of obesity and her BMI today is 40.5. Julie Flowers is in the action stage of change.   ADVISE: Julie Flowers was educated on the multiple health risks of obesity as well as the benefit of weight loss to improve her health. She was advised of the need for long term treatment and the importance of lifestyle modifications to improve her current health and to decrease her risk of future health problems.  AGREE: Multiple dietary modification options and treatment options were discussed and Julie Flowers agreed to follow the recommendations documented in the above note.  ARRANGE: Julie Flowers was educated on the importance of frequent visits to treat obesity as outlined per CMS and  USPSTF guidelines and agreed to schedule her next follow up appointment today.  Attestation Statements:   Reviewed by clinician on day of visit: allergies, medications, problem list, medical history, surgical history, family history, social history, and previous encounter notes.  Edmund Hilda, CMA, am acting as transcriptionist for [employer], DO.  I have reviewed the above documentation for accuracy and completeness, and I agree with the above. Carlye Grippe, D.O.  The 21st Century Cures Act was signed into law in 2016 which includes the topic of electronic health records.  This provides immediate access to information in MyChart.  This includes consultation notes, operative notes, office notes, lab results and pathology reports.  If you have any questions about what you read please let us know at your next visit so we can discuss your  concerns and take corrective action if need be.  We are right here with you.

## 2021-03-10 ENCOUNTER — Ambulatory Visit (INDEPENDENT_AMBULATORY_CARE_PROVIDER_SITE_OTHER): Payer: Medicare Other | Admitting: Bariatrics

## 2021-03-15 ENCOUNTER — Other Ambulatory Visit: Payer: Self-pay

## 2021-03-15 ENCOUNTER — Encounter (INDEPENDENT_AMBULATORY_CARE_PROVIDER_SITE_OTHER): Payer: Self-pay | Admitting: Bariatrics

## 2021-03-15 ENCOUNTER — Ambulatory Visit (INDEPENDENT_AMBULATORY_CARE_PROVIDER_SITE_OTHER): Payer: BC Managed Care – PPO | Admitting: Bariatrics

## 2021-03-15 VITALS — BP 140/82 | HR 66 | Temp 98.1°F | Ht 60.0 in | Wt 207.0 lb

## 2021-03-15 DIAGNOSIS — R7303 Prediabetes: Secondary | ICD-10-CM

## 2021-03-15 DIAGNOSIS — Z6841 Body Mass Index (BMI) 40.0 and over, adult: Secondary | ICD-10-CM | POA: Diagnosis not present

## 2021-03-15 DIAGNOSIS — E669 Obesity, unspecified: Secondary | ICD-10-CM

## 2021-03-15 DIAGNOSIS — E559 Vitamin D deficiency, unspecified: Secondary | ICD-10-CM | POA: Diagnosis not present

## 2021-03-15 MED ORDER — VITAMIN D (ERGOCALCIFEROL) 1.25 MG (50000 UNIT) PO CAPS: 50000.0000 [IU] | ORAL_CAPSULE | ORAL | 0 refills | Status: DC

## 2021-03-15 MED ORDER — METFORMIN HCL 500 MG PO TABS: ORAL_TABLET | ORAL | 0 refills | Status: DC

## 2021-03-15 NOTE — Progress Notes (Signed)
Chief Complaint:   OBESITY Julie Flowers is here to discuss her progress with her obesity treatment plan along with follow-up of her obesity related diagnoses. Julie Flowers is on the Category 1 Plan and states she is following her eating plan approximately 75% of the time. [name] states she is walking for 3 hours 7 times per week.   Today's visit was #: 4 Starting weight: 218 lbs Starting date: 01/12/2021 Today's weight: 207 lbs Today's date: 03/15/2021 Total lbs lost to date: 11 Total lbs lost since last in-office visit: 0  Interim History: Julie Flowers weight remains the same. She is getting in adequate protein and water.  Subjective:   1. Vitamin D deficiency Alvaretta is taking Vitamin D as directed.  2. Pre-diabetes Julie Flowers notes decreased appreciate, and she is taking metformin.  Assessment/Plan:   1. Vitamin D deficiency Low Vitamin D level contributes to fatigue and are associated with obesity, breast, and colon cancer. We will refill prescription Vitamin D for 1 month. Suriyah will follow-up for routine testing of Vitamin D, at least 2-3 times per year to avoid over-replacement.  - Vitamin D, Ergocalciferol, (DRISDOL) 1.25 MG (50000 UNIT) CAPS capsule; Take 1 capsule (50,000 Units total) by mouth every 7 (seven) days.  Dispense: 4 capsule; Refill: 0  2. Pre-diabetes [name] will continue to work on weight loss, exercise, and decreasing simple carbohydrates to help decrease the risk of diabetes. We will refill metformin for 1 month.  - metFORMIN (GLUCOPHAGE) 500 MG tablet; 1 po with lunch QD  Dispense: 30 tablet; Refill: 0  3. Obesity with current BMI of 40.6 Julie Flowers is currently in the action stage of change. As such, her goal is to continue with weight loss efforts. She has agreed to the Category 1 Plan.   Lue will adhere closely to the plan, and she will no skip meals and no added salt.  Exercise goals: As is.  Behavioral modification strategies: increasing lean protein intake, decreasing  simple carbohydrates, increasing vegetables, increasing water intake, decreasing eating out, no skipping meals, meal planning and cooking strategies, keeping healthy foods in the home, and planning for success.  Julie Flowers has agreed to follow-up with our clinic in 2 weeks with Dr. Sharee Holster. She was informed of the importance of frequent follow-up visits to maximize her success with intensive lifestyle modifications for her multiple health conditions.   Objective:   Blood pressure 140/82, pulse 66, temperature 98.1 F (36.7 C), height 5' (1.524 m), weight 207 lb (93.9 kg), last menstrual period 07/24/2003, SpO2 96 %. Body mass index is 40.43 kg/m.  General: Cooperative, alert, well developed, in no acute distress. HEENT: Conjunctivae and lids unremarkable. Cardiovascular: Regular rhythm.  Lungs: Normal work of breathing. Neurologic: No focal deficits.   Lab Results  Component Value Date   CREATININE 1.05 (H) 01/12/2021   BUN 12 01/12/2021   NA 142 01/12/2021   K 4.3 01/12/2021   CL 105 01/12/2021   CO2 24 01/12/2021   Lab Results  Component Value Date   ALT 10 01/12/2021   AST 20 01/12/2021   ALKPHOS 90 01/12/2021   BILITOT 0.4 01/12/2021   Lab Results  Component Value Date   HGBA1C 6.3 (H) 01/12/2021   Lab Results  Component Value Date   INSULIN 7.8 01/12/2021   Lab Results  Component Value Date   TSH 2.250 01/12/2021   Lab Results  Component Value Date   CHOL 216 (H) 01/12/2021   HDL 68 01/12/2021   LDLCALC 129 (H) 01/12/2021  TRIG 108 01/12/2021   Lab Results  Component Value Date   VD25OH 15.4 (L) 01/12/2021   Lab Results  Component Value Date   WBC 5.9 01/12/2021   HGB 12.4 01/12/2021   HCT 37.3 01/12/2021   MCV 87 01/12/2021   PLT 291 01/12/2021   Lab Results  Component Value Date   FERRITIN 310 (H) 06/18/2019    Obesity Behavioral Intervention:   Approximately 15 minutes were spent on the discussion below.  ASK: We discussed the diagnosis of  obesity with Julie Flowers today and Julie Flowers agreed to give Julie Flowers permission to discuss obesity behavioral modification therapy today.  ASSESS: Julie Flowers has the diagnosis of obesity and her BMI today is 40.6. Julie Flowers is in the action stage of change.   ADVISE: Julie Flowers was educated on the multiple health risks of obesity as well as the benefit of weight loss to improve her health. She was advised of the need for long term treatment and the importance of lifestyle modifications to improve her current health and to decrease her risk of future health problems.  AGREE: Multiple dietary modification options and treatment options were discussed and Julie Flowers agreed to follow the recommendations documented in the above note.  ARRANGE: Julie Flowers was educated on the importance of frequent visits to treat obesity as outlined per CMS and USPSTF guidelines and agreed to schedule her next follow up appointment today.  Attestation Statements:   Reviewed by clinician on day of visit: allergies, medications, problem list, medical history, surgical history, family history, social history, and previous encounter notes.   Trude Mcburney, am acting as Energy manager for [employer], DO.  I have reviewed the above documentation for accuracy and completeness, and I agree with the above. Corinna Capra, DO

## 2021-03-16 ENCOUNTER — Other Ambulatory Visit (INDEPENDENT_AMBULATORY_CARE_PROVIDER_SITE_OTHER): Payer: Self-pay | Admitting: Bariatrics

## 2021-03-16 DIAGNOSIS — R7303 Prediabetes: Secondary | ICD-10-CM

## 2021-03-16 DIAGNOSIS — E559 Vitamin D deficiency, unspecified: Secondary | ICD-10-CM

## 2021-03-16 NOTE — Telephone Encounter (Signed)
Dr.Brown 

## 2021-03-19 ENCOUNTER — Encounter (INDEPENDENT_AMBULATORY_CARE_PROVIDER_SITE_OTHER): Payer: Self-pay | Admitting: Bariatrics

## 2021-03-24 DIAGNOSIS — Z9189 Other specified personal risk factors, not elsewhere classified: Secondary | ICD-10-CM | POA: Diagnosis not present

## 2021-03-24 DIAGNOSIS — Z1152 Encounter for screening for COVID-19: Secondary | ICD-10-CM | POA: Diagnosis not present

## 2021-03-29 ENCOUNTER — Telehealth (INDEPENDENT_AMBULATORY_CARE_PROVIDER_SITE_OTHER): Payer: Medicare Other | Admitting: Family Medicine

## 2021-03-29 ENCOUNTER — Encounter (INDEPENDENT_AMBULATORY_CARE_PROVIDER_SITE_OTHER): Payer: Self-pay | Admitting: Family Medicine

## 2021-03-29 ENCOUNTER — Other Ambulatory Visit: Payer: Self-pay

## 2021-03-29 DIAGNOSIS — E559 Vitamin D deficiency, unspecified: Secondary | ICD-10-CM

## 2021-03-29 DIAGNOSIS — R7303 Prediabetes: Secondary | ICD-10-CM

## 2021-03-29 DIAGNOSIS — Z6841 Body Mass Index (BMI) 40.0 and over, adult: Secondary | ICD-10-CM | POA: Diagnosis not present

## 2021-03-29 MED ORDER — VITAMIN D (ERGOCALCIFEROL) 1.25 MG (50000 UNIT) PO CAPS: 50000.0000 [IU] | ORAL_CAPSULE | ORAL | 0 refills | Status: DC

## 2021-03-29 MED ORDER — METFORMIN HCL 500 MG PO TABS: ORAL_TABLET | ORAL | 0 refills | Status: DC

## 2021-03-29 NOTE — Progress Notes (Signed)
TeleHealth Visit:  Due to the COVID-19 pandemic, this visit was completed with telemedicine (audio/video) technology to reduce patient and provider exposure as well as to preserve personal protective equipment.   Julie Flowers has verbally consented to this TeleHealth visit. The patient is located at home, the provider is located at the [facility] office. The participants in this visit include the listed provider and patient. The visit was conducted today via MyChart video.   Chief Complaint: OBESITY Julie Flowers is here to discuss her progress with her obesity treatment plan along with follow-up of her obesity related diagnoses. Julie Flowers is on the Category 1 Plan and states she is following her eating plan approximately 30-40% of the time. Julie Flowers states she is walking for 30 minutes 3 times per week.  Today's visit was #: 5 Starting weight: 218 lbs Starting date: 01/12/2021  Interim History: Julie Flowers was exposed to her grandson who was sick last week. She went to urgent care last week, and no improvement so she is on her way to urgent care again today. She is sleeping a lot and not eating much. She thinks she has lost weight from prior.  Subjective:   1. Pre-diabetes with polyphagia Sammy notes metformin is helping a lot with sweet cravings. She has no issues with the medicine, and she takes it regularly. Last A1c and fasting insulin were both elevated.  2. Vitamin D deficiency Julie Flowers is currently taking prescription vitamin D 50,000 IU each week. Her medications compliance is excellent. She denies nausea, vomiting or muscle weakness.  Assessment/Plan:   Orders Placed This Encounter  Procedures   Hemoglobin A1c   VITAMIN D 25 Hydroxy (Vit-D Deficiency, Fractures)    Medications Discontinued During This Encounter  Medication Reason   metFORMIN (GLUCOPHAGE) 500 MG tablet Reorder   Vitamin D, Ergocalciferol, (DRISDOL) 1.25 MG (50000 UNIT) CAPS capsule Reorder     Meds ordered this  encounter  Medications   metFORMIN (GLUCOPHAGE) 500 MG tablet    Sig: 1 po with lunch QD    Dispense:  30 tablet    Refill:  0    30 d supply; ov for RF   Vitamin D, Ergocalciferol, (DRISDOL) 1.25 MG (50000 UNIT) CAPS capsule    Sig: Take 1 capsule (50,000 Units total) by mouth every 7 (seven) days.    Dispense:  4 capsule    Refill:  0    Ov for RF; 30 d supply     1. Pre-diabetes with polyphagia Julie Flowers will continue to work on weight loss, exercise, and decreasing simple carbohydrates to help decrease the risk of diabetes. We will refill metformin for 1 month, and we will recheck A1c at her next office visit.  - metFORMIN (GLUCOPHAGE) 500 MG tablet; 1 po with lunch QD  Dispense: 30 tablet; Refill: 0 - Hemoglobin A1c  2. Vitamin D deficiency Low Vitamin D level contributes to fatigue and are associated with obesity, breast, and colon cancer. We will refill prescription Vitamin D for 1 month, and we will recheck her Vit D at her next office visit. Julie Flowers will follow-up for routine testing of Vitamin D, at least 2-3 times per year to avoid over-replacement.  - Vitamin D, Ergocalciferol, (DRISDOL) 1.25 MG (50000 UNIT) CAPS capsule; Take 1 capsule (50,000 Units total) by mouth every 7 (seven) days.  Dispense: 4 capsule; Refill: 0 - VITAMIN D 25 Hydroxy (Vit-D Deficiency, Fractures)  3. Obesity with current BMI of 40.43 Julie Flowers is currently in the action stage of  change. As such, her goal is to continue with weight loss efforts. She has agreed to the Category 1 Plan.   We will consider rechecking labs at her next office visit or the following (A1c and Vit D).  Exercise goals: As is.  Behavioral modification strategies: keeping healthy foods in the home and avoiding temptations.  Julie Flowers has agreed to follow-up with our clinic in 3 weeks. She was informed of the importance of frequent follow-up visits to maximize her success with intensive lifestyle modifications for her multiple health  conditions.  Objective:   VITALS: Per patient if applicable, see vitals. GENERAL: Alert and in no acute distress. CARDIOPULMONARY: No increased WOB. Speaking in clear sentences.  PSYCH: Pleasant and cooperative. Speech normal rate and rhythm. Affect is appropriate. Insight and judgement are appropriate. Attention is focused, linear, and appropriate.  NEURO: Oriented as arrived to appointment on time with no prompting.   Lab Results  Component Value Date   CREATININE 1.05 (H) 01/12/2021   BUN 12 01/12/2021   NA 142 01/12/2021   K 4.3 01/12/2021   CL 105 01/12/2021   CO2 24 01/12/2021   Lab Results  Component Value Date   ALT 10 01/12/2021   AST 20 01/12/2021   ALKPHOS 90 01/12/2021   BILITOT 0.4 01/12/2021   Lab Results  Component Value Date   HGBA1C 6.3 (H) 01/12/2021   Lab Results  Component Value Date   INSULIN 7.8 01/12/2021   Lab Results  Component Value Date   TSH 2.250 01/12/2021   Lab Results  Component Value Date   CHOL 216 (H) 01/12/2021   HDL 68 01/12/2021   LDLCALC 129 (H) 01/12/2021   TRIG 108 01/12/2021   Lab Results  Component Value Date   VD25OH 15.4 (L) 01/12/2021   Lab Results  Component Value Date   WBC 5.9 01/12/2021   HGB 12.4 01/12/2021   HCT 37.3 01/12/2021   MCV 87 01/12/2021   PLT 291 01/12/2021   Lab Results  Component Value Date   FERRITIN 310 (H) 06/18/2019    Attestation Statements:   Reviewed by clinician on day of visit: allergies, medications, problem list, medical history, surgical history, family history, social history, and previous encounter notes.   Trude Mcburney, am acting as transcriptionist for [employer], DO.  I have reviewed the above documentation for accuracy and completeness, and I agree with the above. Carlye Grippe, D.O.  The 21st Century Cures Act was signed into law in 2016 which includes the topic of electronic health records.  This provides immediate access to information in MyChart.   This includes consultation notes, operative notes, office notes, lab results and pathology reports.  If you have any questions about what you read please let us know at your next visit so we can discuss your concerns and take corrective action if need be.  We are right here with you.

## 2021-06-22 ENCOUNTER — Ambulatory Visit (HOSPITAL_COMMUNITY)
Admission: RE | Admit: 2021-06-22 | Discharge: 2021-06-22 | Disposition: A | Discharge status: Home / Self Care | Payer: BC Managed Care – PPO | Source: Ambulatory Visit | Attending: Physician Assistant | Admitting: Physician Assistant

## 2021-06-22 ENCOUNTER — Ambulatory Visit: Payer: PPO | Admitting: Physician Assistant

## 2021-06-22 ENCOUNTER — Other Ambulatory Visit: Payer: Self-pay

## 2021-06-22 VITALS — BP 135/71 | HR 74 | Temp 98.2°F | Resp 18 | Ht 65.0 in | Wt 204.0 lb

## 2021-06-22 DIAGNOSIS — M12811 Other specific arthropathies, not elsewhere classified, right shoulder: Secondary | ICD-10-CM | POA: Diagnosis not present

## 2021-06-22 DIAGNOSIS — M25511 Pain in right shoulder: Secondary | ICD-10-CM | POA: Insufficient documentation

## 2021-06-22 MED ORDER — PREDNISONE 10 MG PO TABS: ORAL_TABLET | ORAL | 0 refills | Status: AC

## 2021-06-22 NOTE — Progress Notes (Signed)
Patient has eaten today. Patient reports R shoulder pain presenting 3 weeks ago after having a cramp in her neck. patient reports Hx of cramps. Patient states pain is intermittent increasing through out the day and radiates down the arm now.

## 2021-06-22 NOTE — Progress Notes (Signed)
New Patient Office Visit  Subjective:  Patient ID: Julie Flowers, female    DOB: Nov 08, 1955  Age: 66 y.o. MRN: [id number]  CC:  Chief Complaint  Patient presents with   Shoulder Pain    RIGHT    HPI Julie Flowers states that she has been having right shoudler pain for the past 3 weeks, denies injury or trauma.  Does endorse that she has been having chronic spasms in her neck which she does daily massage for.  Reports that she noticed the pain start after one of her neck spasms.  Denies numbness or tingling.  Reports 2 weeks ago she was having difficulty moving her arm at all, states it has improved but is still having pain with range of motion.  Does also endorse that she works as an Biomedical scientist and does notice that she leans to her right, resting her arm in the middle console.  Reports that she has been using ibuprofen, Tylenol, diclofenac gel and other over-the-counter pain creams without much relief.   Past Medical History:  Diagnosis Date   Anemia    Bilateral swelling of feet and ankles    Chest pain    Constipation    Essential hypertension 10/14/2020   GERD (gastroesophageal reflux disease)    Headache    History of stomach ulcers    History of uterine fibroid    Leg pain, bilateral    Other fatigue    Pre-diabetes    Prediabetes    Shortness of breath    Shortness of breath on exertion     Past Surgical History:  Procedure Laterality Date   BREAST LUMPECTOMY Right 10+ years ago   Benign   CYSTECTOMY  10 years ago   wrist    LAPAROSCOPIC VAGINAL HYSTERECTOMY  15 years ago   due to fibroids    Family History  Problem Relation Age of Onset   Obesity Mother    Hypertension Mother    Diabetes Mother    Leukemia Maternal Uncle    Diabetes Maternal Uncle    Obesity Other     Social History   Socioeconomic History   Marital status: Single    Spouse name: Not on file   Number of children: Not on file   Years of education: Not on file   Highest  education level: Not on file  Occupational History   Occupation: Retired   Occupation: Part time Biomedical scientist  Tobacco Use   Smoking status: Never   Smokeless tobacco: Never  Vaping Use   Vaping Use: Never used  Substance and Sexual Activity   Alcohol use: Never   Drug use: Never   Sexual activity: Not Currently  Other Topics Concern   Not on file  Social History Narrative   Not on file   Social Determinants of Health   Financial Resource Strain: Not on file  Food Insecurity: Not on file  Transportation Needs: Not on file  Physical Activity: Not on file  Stress: Not on file  Social Connections: Not on file  Intimate Partner Violence: Not on file    ROS Review of Systems  Constitutional: Negative.   HENT: Negative.    Eyes: Negative.   Respiratory:  Negative for shortness of breath.   Cardiovascular:  Negative for chest pain.  Gastrointestinal: Negative.   Endocrine: Negative.   Genitourinary: Negative.   Musculoskeletal:  Positive for arthralgias and myalgias.  Skin: Negative.   Allergic/Immunologic: Negative.   Neurological: Negative.   Hematological:  Negative.   Psychiatric/Behavioral: Negative.     Objective:   Today's Vitals: BP 135/71 (BP Location: Left Arm, Patient Position: Sitting, Cuff Size: Normal)    Pulse 74    Temp 98.2 F (36.8 C) (Oral)    Resp 18    Ht 5\' 5"  (1.651 m)    Wt 204 lb (92.5 kg)    LMP 07/24/2003    SpO2 96%    BMI 33.95 kg/m   Physical Exam Vitals and nursing note reviewed.  Constitutional:      Appearance: Normal appearance.  HENT:     Head: Normocephalic and atraumatic.     Right Ear: External ear normal.     Left Ear: External ear normal.     Nose: Nose normal.     Mouth/Throat:     Mouth: Mucous membranes are moist.     Pharynx: Oropharynx is clear.  Eyes:     Extraocular Movements: Extraocular movements intact.     Conjunctiva/sclera: Conjunctivae normal.     Pupils: Pupils are equal, round, and reactive to light.   Cardiovascular:     Rate and Rhythm: Normal rate and regular rhythm.     Pulses: Normal pulses.     Heart sounds: Normal heart sounds.  Pulmonary:     Effort: Pulmonary effort is normal.     Breath sounds: Normal breath sounds.  Musculoskeletal:     Right shoulder: Tenderness present. No swelling. Decreased range of motion. Decreased strength.     Left shoulder: Normal.     Right upper arm: No swelling or tenderness.     Left upper arm: Normal.     Right hand: Normal. Normal strength.     Left hand: Normal. Normal strength.     Cervical back: Normal range of motion and neck supple.  Skin:    General: Skin is warm and dry.  Neurological:     General: No focal deficit present.     Mental Status: She is alert and oriented to person, place, and time.  Psychiatric:        Mood and Affect: Mood normal.        Behavior: Behavior normal.        Thought Content: Thought content normal.        Judgment: Judgment normal.    Assessment & Plan:   Problem List Items Addressed This Visit   None Visit Diagnoses     Acute pain of right shoulder    -  Primary   Relevant Medications   predniSONE (DELTASONE) 10 MG tablet   Other Relevant Orders   DG Shoulder Right       Outpatient Encounter Medications as of 06/22/2021  Medication Sig   predniSONE (DELTASONE) 10 MG tablet Take 4 tablets (40 mg total) by mouth daily with breakfast for 3 days, THEN 3 tablets (30 mg total) daily with breakfast for 3 days, THEN 2 tablets (20 mg total) daily with breakfast for 3 days, THEN 1 tablet (10 mg total) daily with breakfast for 3 days.   diphenhydrAMINE (BENADRYL) 25 mg capsule Take 25 mg by mouth 2 (two) times daily as needed for allergies.   ibuprofen (ADVIL) 200 MG tablet Take 200 mg by mouth every 6 (six) hours as needed.   metFORMIN (GLUCOPHAGE) 500 MG tablet 1 po with lunch QD   Vitamin D, Ergocalciferol, (DRISDOL) 1.25 MG (50000 UNIT) CAPS capsule Take 1 capsule (50,000 Units total) by mouth  every 7 (seven) days.   No facility-administered  encounter medications on file as of 06/22/2021.  1. Acute pain of right shoulder Probable tendinitis, trial prednisone taper, continue ibuprofen over-the-counter.  Patient education given on supportive care, red flags for prompt reevaluation. - DG Shoulder Right; Future - predniSONE (DELTASONE) 10 MG tablet; Take 4 tablets (40 mg total) by mouth daily with breakfast for 3 days, THEN 3 tablets (30 mg total) daily with breakfast for 3 days, THEN 2 tablets (20 mg total) daily with breakfast for 3 days, THEN 1 tablet (10 mg total) daily with breakfast for 3 days.  Dispense: 30 tablet; Refill: 0    I have reviewed the patient's medical history (PMH, PSH, Social History, Family History, Medications, and allergies) , and have been updated if relevant. I spent 20 minutes reviewing chart and  face to face time with patient.      Follow-up: Return if symptoms worsen or fail to improve.   Kasandra Knudsen Mayers, PA-C

## 2021-06-22 NOTE — Patient Instructions (Signed)
You are going to take a steroid taper to help with the inflammation in your shoulder.  I do encourage you to continue using ibuprofen and topical pain creams.  I encourage you to work on gentle stretching.  We will call you with the results of your x-ray.  Please let us know if there is anything else we can do for you  Roney Jaffe, PA-C Physician Assistant [facility] [email or web address]   Tendinitis Tendinitis is inflammation of a tendon. A tendon is a strong cord of tissue that connects muscle to bone. Tendinitis can affect any tendon, but it most commonly affects the: Shoulder tendon (rotator cuff). Ankle tendon (Achilles tendon). Elbow tendon (triceps tendon). Tendons in the wrist. What are the causes? This condition may be caused by: Overusing a tendon or muscle. This is common. Age-related wear and tear. Injury. Inflammatory conditions, such as arthritis. Certain medicines. What increases the risk? You are more likely to develop this condition if you do activities that involve the same movements over and over again (repetitive motions). What are the signs or symptoms? Symptoms of this condition may include: Pain. Tenderness. Mild swelling. Decreased range of motion. How is this diagnosed? This condition is diagnosed with a physical exam. You may also have tests, such as: Ultrasound. This uses sound waves to make an image of the inside of your body in the affected area. MRI. How is this treated? This condition may be treated by resting, icing, applying pressure (compression), and raising (elevating) the affected area above the level of your heart. This is known as RICE therapy. Treatment may also include: Medicines to help reduce inflammation or to help reduce pain. Exercises or physical therapy to strengthen and stretch the tendon. A brace or splint. Surgery. This is rarely needed. Follow these instructions at  home: If you have a splint or brace: Wear the splint or brace as told by your health care provider. Remove it only as told by your health care provider. Loosen the splint or brace if your fingers or toes tingle, become numb, or turn cold and blue. Keep the splint or brace clean. If the splint or brace is not waterproof: Do not let it get wet. Cover it with a watertight covering when you take a bath or shower. Managing pain, stiffness, and swelling If directed, put ice on the affected area. If you have a removable splint or brace, remove it as told by your health care provider. Put ice in a plastic bag. Place a towel between your skin and the bag. Leave the ice on for 20 minutes, 2-3 times a day. Move the fingers or toes of the affected limb often, if this applies. This can help to prevent stiffness and lessen swelling. If directed, raise (elevate) the affected area above the level of your heart while you are sitting or lying down. If directed, apply heat to the affected area before you exercise. Use the heat source that your health care provider recommends, such as a moist heat pack or a heating pad.   Place a towel between your skin and the heat source. Leave the heat on for 20-30 minutes. Remove the heat if your skin turns bright red. This is especially important if you are unable to feel pain, heat, or cold. You may have a greater risk of getting burned. Driving Do not drive or use heavy machinery while taking prescription pain medicine. Ask your health care provider when it is safe to drive if you have  a splint or brace on any part of your arm or leg. Activity Rest the affected area as told by your health care provider. Return to your normal activities as told by your health care provider. Ask your health care provider what activities are safe for you. Avoid using the affected area while you are experiencing symptoms of tendinitis. Do exercises as told by your health care  provider. General instructions If you have a splint, do not put pressure on any part of the splint until it is fully hardened. This may take several hours. Wear an elastic bandage or compression wrap only as told by your health care provider. Take over-the-counter and prescription medicines only as told by your health care provider. Keep all follow-up visits as told by your health care provider. This is important. Contact a health care provider if: Your symptoms do not improve. You develop new, unexplained problems, such as numbness in your hands. Summary Tendinitis is inflammation of a tendon. You are more likely to develop this condition if you do activities that involve the same movements over and over again. This condition may be treated by resting, icing, applying pressure (compression), and elevating the area above the level of your heart. This is known as RICE therapy. Avoid using the affected area while you are experiencing symptoms of tendinitis. This information is not intended to replace advice given to you by your health care provider. Make sure you discuss any questions you have with your health care provider. Document Revised: 11/14/2017 Document Reviewed: 09/27/2017 Elsevier Patient Education  2022 [employer].

## 2021-06-23 NOTE — Addendum Note (Signed)
Addended by: Roney Jaffe on: 06/23/2021 10:59 AM   Modules accepted: Orders

## 2021-06-24 ENCOUNTER — Telehealth: Payer: Self-pay | Admitting: *Deleted

## 2021-06-24 NOTE — Telephone Encounter (Signed)
Patient verified DOB Patient is aware of results and was contacted by ortho on yesterday to schedule appointment.

## 2021-06-24 NOTE — Telephone Encounter (Signed)
-----   Message from Roney Jaffe, [place] sent at 06/23/2021 10:59 AM EST ----- Please call patient and let her know that her x-ray did show some rotator cuff arthropathy, I am starting a referral for her to be seen by orthopedics for further evaluation.

## 2021-06-29 ENCOUNTER — Other Ambulatory Visit: Payer: Self-pay

## 2021-06-29 ENCOUNTER — Ambulatory Visit: Payer: PPO | Admitting: Orthopaedic Surgery

## 2021-06-29 ENCOUNTER — Encounter: Payer: Self-pay | Admitting: Physician Assistant

## 2021-06-29 DIAGNOSIS — M25511 Pain in right shoulder: Secondary | ICD-10-CM | POA: Diagnosis not present

## 2021-06-29 DIAGNOSIS — G8929 Other chronic pain: Secondary | ICD-10-CM

## 2021-06-29 MED ORDER — DICLOFENAC SODIUM 75 MG PO TBEC: 75.0000 mg | DELAYED_RELEASE_TABLET | Freq: Two times a day (BID) | ORAL | 0 refills | Status: DC | PRN

## 2021-06-29 NOTE — Progress Notes (Signed)
Office Visit Note   Patient: Julie Flowers           Date of Birth: Jul 30, 1955           MRN: [id number] Visit Date: 06/29/2021              Requested by: Mayers, Loraine Grip, PA-C [address],  [place] [address] PCP: Caren Macadam, MD   Assessment & Plan: Visit Diagnoses:  1. Chronic right shoulder pain     Plan: Impression is right shoulder glenohumeral osteoarthritis.  The patient does appear to be significantly better after starting on the steroid taper.  We have discussed that should her symptoms return that we would refer her to Dr. Ernestina Patches for intra-articular cortisone injection.  She is agreeable to this plan.  She will follow-up with [place] as needed.  Follow-Up Instructions: Return if symptoms worsen or fail to improve.   Orders:  No orders of the defined types were placed in this encounter.  Meds ordered this encounter  Medications   diclofenac (VOLTAREN) 75 MG EC tablet    Sig: Take 1 tablet (75 mg total) by mouth 2 (two) times daily as needed.    Dispense:  60 tablet    Refill:  0      Procedures: No procedures performed   Clinical Data: No additional findings.   Subjective: Chief Complaint  Patient presents with   Right Shoulder - Pain    HPI patient is a pleasant 66 year old female who comes in today with right shoulder pain for the past 4 weeks, but has significantly improved over the past few days after being on a steroid pack.  She denies any injury or change in activity.  The pain was to the top of her shoulder and would radiate to the deltoid.  Pain would be worse with sleeping on the left side as well as with any movement of the shoulder.  She denies any numbness, tingling or burning or even weakness to the left upper extremity.  Review of Systems as detailed in HPI.  All others reviewed and are negative   Objective: Vital Signs: LMP 07/24/2003   Physical Exam.  Well-developed well-nourished female no acute distress.  Alert and  oriented x3.  Ortho Exam right shoulder exam reveals full active painless range of motion in all planes.  She can internally rotate to L5.  She does have a positive empty can test.  She has full strength throughout.  She is neurovascular intact distally.  Specialty Comments:  No specialty comments available.  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   Viral hepatitis B without hepatic coma 10/14/2020   BMI 38.0-38.9,adult 10/14/2020   Pneumonia due to COVID-19 virus 06/15/2019   AKI (acute kidney injury) ([place]) 06/15/2019   Dehydration 06/15/2019   Past Medical History:  Diagnosis Date   Anemia    Bilateral swelling of feet and ankles    Chest pain    Constipation    Essential hypertension 10/14/2020   GERD (gastroesophageal reflux disease)    Headache    History of stomach ulcers    History of uterine fibroid    Leg pain, bilateral    Other fatigue    Pre-diabetes    Prediabetes  Shortness of breath    Shortness of breath on exertion     Family History  Problem Relation Age of Onset   Obesity Mother    Hypertension Mother    Diabetes Mother    Leukemia Maternal Uncle    Diabetes Maternal Uncle    Obesity Other     Past Surgical History:  Procedure Laterality Date   BREAST LUMPECTOMY Right 10+ years ago   Benign   CYSTECTOMY  10 years ago   wrist    LAPAROSCOPIC VAGINAL HYSTERECTOMY  15 years ago   due to fibroids   Social History   Occupational History   Occupation: Retired   Occupation: Part time Mining engineer  Tobacco Use   Smoking status: Never   Smokeless tobacco: Never  Vaping Use   Vaping Use: Never used  Substance and Sexual Activity   Alcohol use: Never   Drug use: Never   Sexual activity:  Not Currently

## 2021-10-05 ENCOUNTER — Ambulatory Visit: Payer: BC Managed Care – PPO | Admitting: Orthopaedic Surgery

## 2021-10-05 ENCOUNTER — Other Ambulatory Visit: Payer: Self-pay

## 2021-10-05 DIAGNOSIS — G8929 Other chronic pain: Secondary | ICD-10-CM

## 2021-11-08 ENCOUNTER — Encounter: Payer: Self-pay | Admitting: Physical Medicine and Rehabilitation

## 2021-11-08 ENCOUNTER — Ambulatory Visit: Payer: PPO | Admitting: Physical Medicine and Rehabilitation

## 2021-11-08 ENCOUNTER — Ambulatory Visit: Payer: Self-pay

## 2021-11-08 DIAGNOSIS — M25511 Pain in right shoulder: Secondary | ICD-10-CM | POA: Diagnosis not present

## 2021-11-08 DIAGNOSIS — G8929 Other chronic pain: Secondary | ICD-10-CM

## 2021-11-08 MED ORDER — TRIAMCINOLONE ACETONIDE 40 MG/ML IJ SUSP
40.0000 mg | INTRAMUSCULAR | Status: AC | PRN
  Administered 2021-11-08: 40 mg via INTRA_ARTICULAR

## 2021-11-08 MED ORDER — BUPIVACAINE HCL 0.25 % IJ SOLN
5.0000 mL | INTRAMUSCULAR | Status: AC | PRN
  Administered 2021-11-08: 5 mL via INTRA_ARTICULAR

## 2021-11-08 NOTE — Progress Notes (Signed)
Pt state right shoulder pain. Pt state laying down and movement with her right arm makes the pain worse. Pt state she takes over the counter pain meds and uses heat to help ease her pain.  Numeric Pain Rating Scale and Functional Assessment Average Pain 4   In the last MONTH (on 0-10 scale) has pain interfered with the following?  1. General activity like being  able to carry out your everyday physical activities such as walking, climbing stairs, carrying groceries, or moving a chair?  Rating(7)    -BT, -Dye Allergies.

## 2021-11-08 NOTE — Progress Notes (Signed)
   Julie Flowers - 66 y.o. female MRN [id number]  Date of birth: 1955/10/11  Office Visit Note: Visit Date: 11/08/2021 PCP: Aliene Beams, MD Referred by: Aliene Beams, MD  Subjective: Chief Complaint  Patient presents with   Right Shoulder - Pain   HPI:  Julie Flowers is a 66 y.o. female who comes in today at the request of Dr. Glee Arvin for planned Right anesthetic glenohumeral arthrogram with fluoroscopic guidance.  The patient has failed conservative care including home exercise, medications, time and activity modification.  This injection will be diagnostic and hopefully therapeutic.  Please see requesting physician notes for further details and justification.   ROS Otherwise per HPI.  Assessment & Plan: Visit Diagnoses:    ICD-10-CM   1. Chronic right shoulder pain  M25.511 Large Joint Inj: R glenohumeral   G89.29 XR C-ARM NO REPORT      Plan: No additional findings.   Meds & Orders: No orders of the defined types were placed in this encounter.   Orders Placed This Encounter  Procedures   Large Joint Inj: R glenohumeral   XR C-ARM NO REPORT    Follow-up: Return for visit to requesting provider as needed.   Procedures: Large Joint Inj: R glenohumeral on 11/08/2021 8:18 AM Indications: pain and diagnostic evaluation Details: 22 G 3.5 in needle, fluoroscopy-guided anteromedial approach  Arthrogram: No  Medications: 40 mg triamcinolone acetonide 40 MG/ML; 5 mL bupivacaine 0.25 % Outcome: tolerated well, no immediate complications  There was excellent flow of contrast producing a partial arthrogram of the glenohumeral joint. The patient did have relief of symptoms during the anesthetic phase of the injection. Procedure, treatment alternatives, risks and benefits explained, specific risks discussed. Consent was given by the patient. Immediately prior to procedure a time out was called to verify the correct patient, procedure, equipment, support staff and site/side  marked as required. Patient was prepped and draped in the usual sterile fashion.          Clinical History: No specialty comments available.     Objective:  VS:  HT:    WT:   BMI:     BP:   HR: bpm  TEMP: ( )  RESP:  Physical Exam   Imaging: No results found.

## 2021-11-15 DIAGNOSIS — Z1159 Encounter for screening for other viral diseases: Secondary | ICD-10-CM | POA: Diagnosis not present

## 2021-11-15 DIAGNOSIS — Z6834 Body mass index (BMI) 34.0-34.9, adult: Secondary | ICD-10-CM | POA: Diagnosis not present

## 2021-11-15 DIAGNOSIS — Z1231 Encounter for screening mammogram for malignant neoplasm of breast: Secondary | ICD-10-CM | POA: Diagnosis not present

## 2021-11-15 DIAGNOSIS — Z0000 Encounter for general adult medical examination without abnormal findings: Secondary | ICD-10-CM | POA: Diagnosis not present

## 2021-11-15 DIAGNOSIS — Z136 Encounter for screening for cardiovascular disorders: Secondary | ICD-10-CM | POA: Diagnosis not present

## 2021-11-15 DIAGNOSIS — Z23 Encounter for immunization: Secondary | ICD-10-CM | POA: Diagnosis not present

## 2021-11-15 DIAGNOSIS — R635 Abnormal weight gain: Secondary | ICD-10-CM | POA: Diagnosis not present

## 2021-11-15 DIAGNOSIS — Z13820 Encounter for screening for osteoporosis: Secondary | ICD-10-CM | POA: Diagnosis not present

## 2021-11-16 ENCOUNTER — Other Ambulatory Visit: Payer: Self-pay | Admitting: Nurse Practitioner

## 2021-11-16 DIAGNOSIS — Z13820 Encounter for screening for osteoporosis: Secondary | ICD-10-CM

## 2021-11-16 DIAGNOSIS — Z1231 Encounter for screening mammogram for malignant neoplasm of breast: Secondary | ICD-10-CM

## 2021-12-13 DIAGNOSIS — H524 Presbyopia: Secondary | ICD-10-CM | POA: Diagnosis not present

## 2021-12-13 DIAGNOSIS — H2513 Age-related nuclear cataract, bilateral: Secondary | ICD-10-CM | POA: Diagnosis not present

## 2021-12-13 DIAGNOSIS — H5213 Myopia, bilateral: Secondary | ICD-10-CM | POA: Diagnosis not present

## 2021-12-13 DIAGNOSIS — H52201 Unspecified astigmatism, right eye: Secondary | ICD-10-CM | POA: Diagnosis not present

## 2021-12-13 DIAGNOSIS — H25013 Cortical age-related cataract, bilateral: Secondary | ICD-10-CM | POA: Diagnosis not present

## 2021-12-14 ENCOUNTER — Ambulatory Visit
Admission: RE | Admit: 2021-12-14 | Discharge: 2021-12-14 | Disposition: A | Discharge status: Home / Self Care | Payer: PPO | Source: Ambulatory Visit | Attending: Nurse Practitioner | Admitting: Nurse Practitioner

## 2021-12-14 DIAGNOSIS — Z1231 Encounter for screening mammogram for malignant neoplasm of breast: Secondary | ICD-10-CM | POA: Diagnosis not present

## 2021-12-15 ENCOUNTER — Ambulatory Visit
Admission: RE | Admit: 2021-12-15 | Discharge: 2021-12-15 | Disposition: A | Discharge status: Home / Self Care | Payer: PPO | Source: Ambulatory Visit | Attending: Nurse Practitioner | Admitting: Nurse Practitioner

## 2021-12-15 DIAGNOSIS — Z13820 Encounter for screening for osteoporosis: Secondary | ICD-10-CM

## 2021-12-15 DIAGNOSIS — M85832 Other specified disorders of bone density and structure, left forearm: Secondary | ICD-10-CM | POA: Diagnosis not present

## 2021-12-15 DIAGNOSIS — Z780 Asymptomatic menopausal state: Secondary | ICD-10-CM | POA: Diagnosis not present

## 2021-12-21 DIAGNOSIS — E663 Overweight: Secondary | ICD-10-CM | POA: Diagnosis not present

## 2021-12-21 DIAGNOSIS — R7301 Impaired fasting glucose: Secondary | ICD-10-CM | POA: Diagnosis not present

## 2021-12-21 DIAGNOSIS — R944 Abnormal results of kidney function studies: Secondary | ICD-10-CM | POA: Diagnosis not present

## 2021-12-21 DIAGNOSIS — E785 Hyperlipidemia, unspecified: Secondary | ICD-10-CM | POA: Diagnosis not present

## 2021-12-29 ENCOUNTER — Encounter (INDEPENDENT_AMBULATORY_CARE_PROVIDER_SITE_OTHER): Payer: Self-pay

## 2022-03-23 DIAGNOSIS — R7301 Impaired fasting glucose: Secondary | ICD-10-CM | POA: Diagnosis not present

## 2022-03-23 DIAGNOSIS — E663 Overweight: Secondary | ICD-10-CM | POA: Diagnosis not present

## 2022-03-23 DIAGNOSIS — Z6834 Body mass index (BMI) 34.0-34.9, adult: Secondary | ICD-10-CM | POA: Diagnosis not present

## 2022-10-10 DIAGNOSIS — H52221 Regular astigmatism, right eye: Secondary | ICD-10-CM | POA: Diagnosis not present

## 2022-10-10 DIAGNOSIS — H16002 Unspecified corneal ulcer, left eye: Secondary | ICD-10-CM | POA: Diagnosis not present

## 2022-10-10 DIAGNOSIS — H16202 Unspecified keratoconjunctivitis, left eye: Secondary | ICD-10-CM | POA: Diagnosis not present

## 2022-10-10 DIAGNOSIS — H524 Presbyopia: Secondary | ICD-10-CM | POA: Diagnosis not present

## 2022-10-13 IMAGING — CR DG SHOULDER 2+V*R*
3 series · 3 of 3 positions shown · non-contrast
Comparison: None.

CLINICAL DATA: Acute pain of right shoulder. Patient reports right
sided neck pain started 3 weeks ago and notes pain radiating down
right lateral shoulder surface to midshaft humerus.

EXAM:
RIGHT SHOULDER - 2+ VIEW

[shoulder grashey]
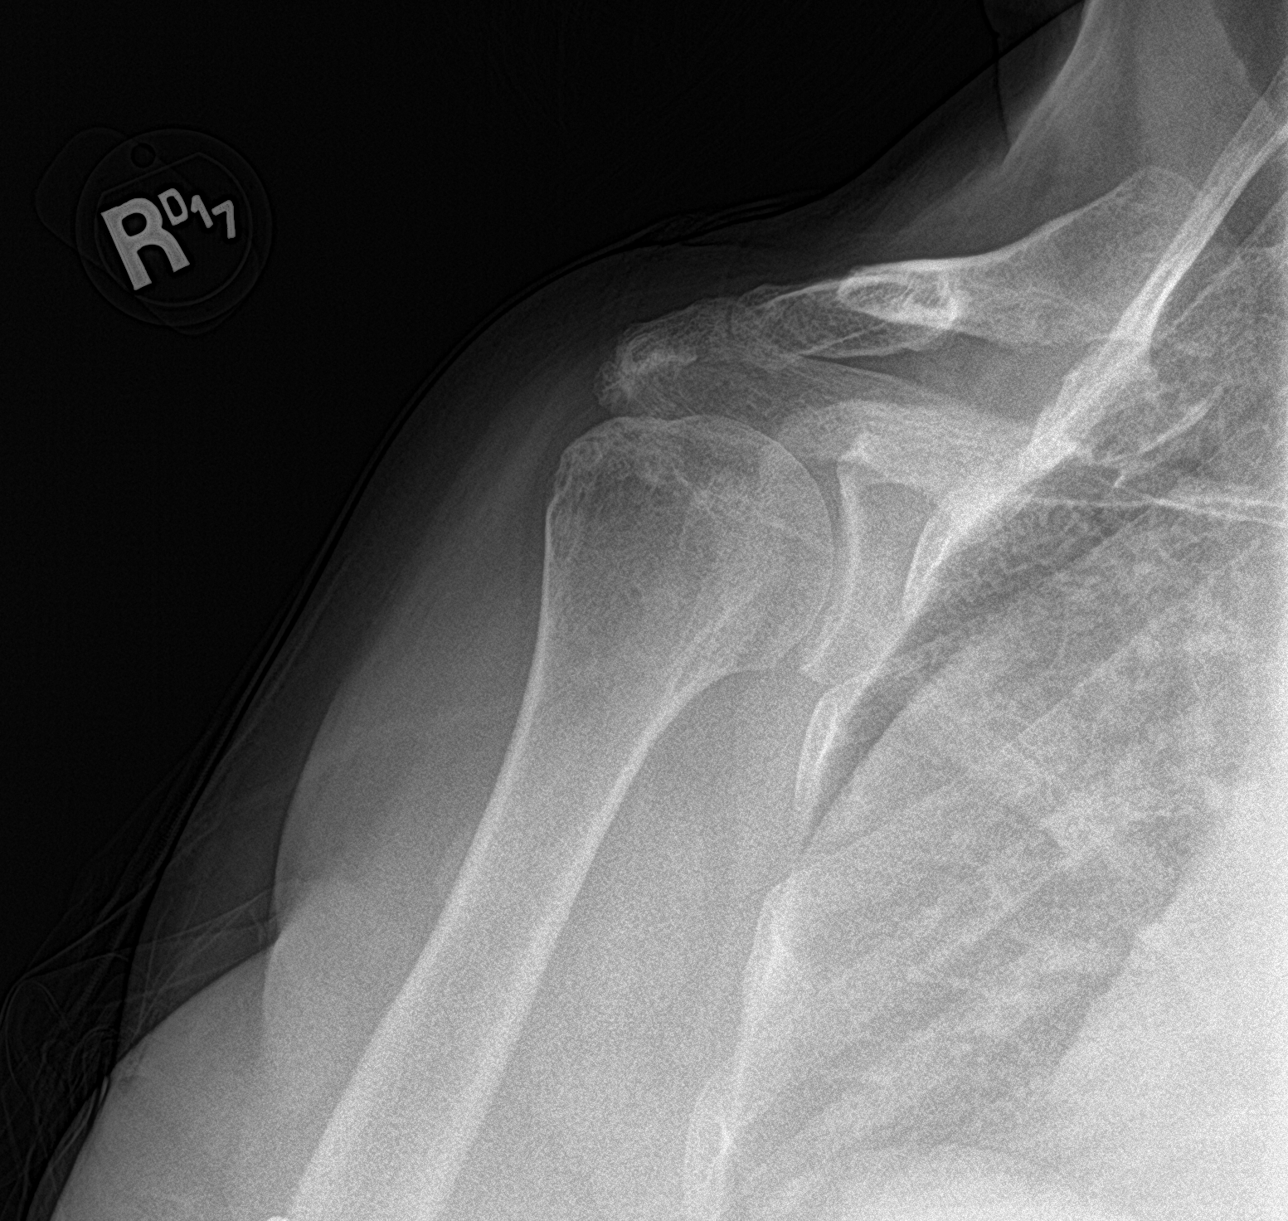

[shoulder y view]
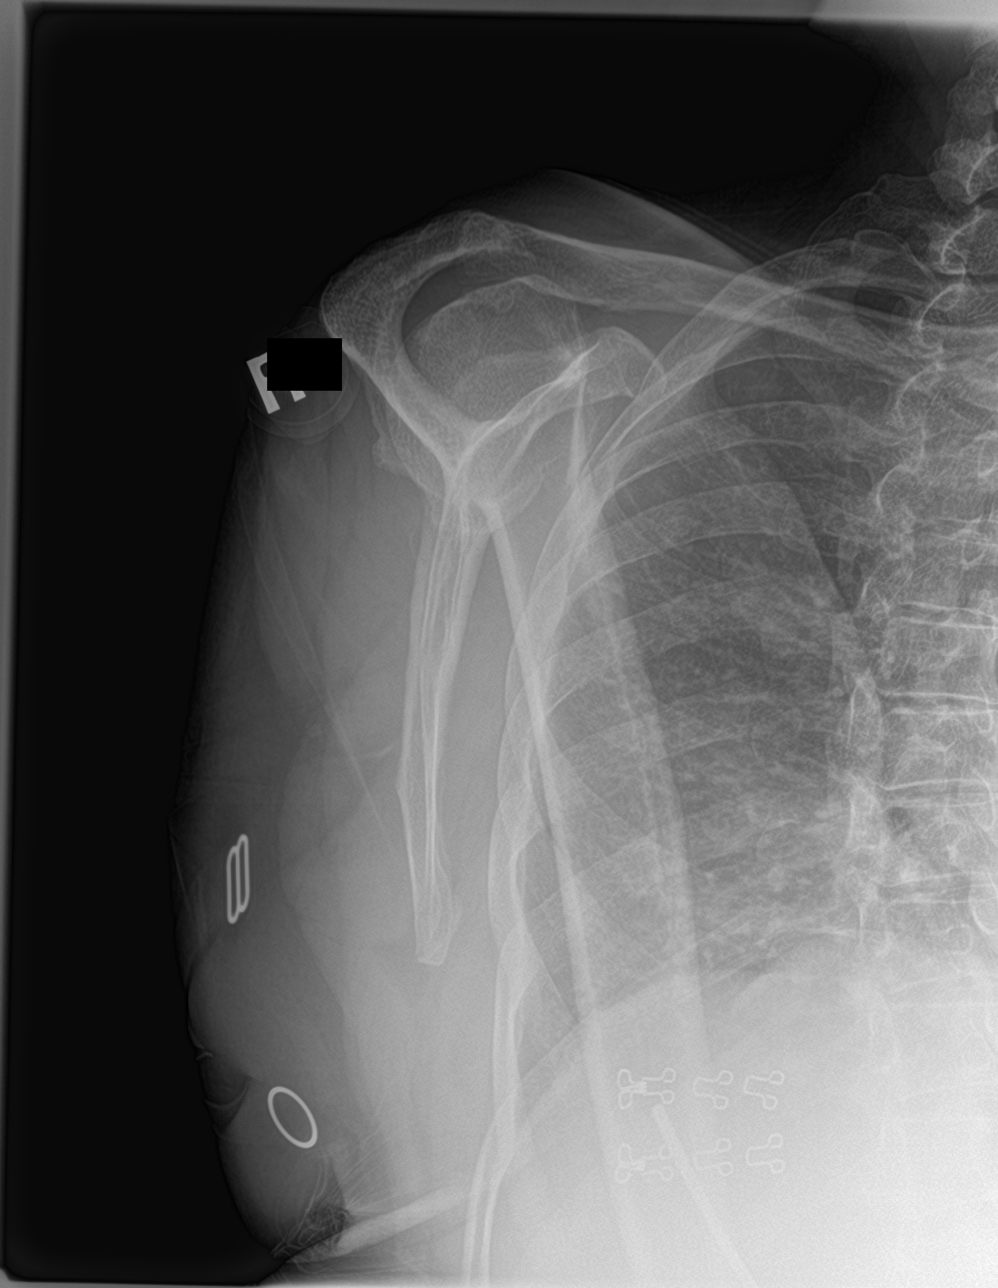

[shoulder axillary]
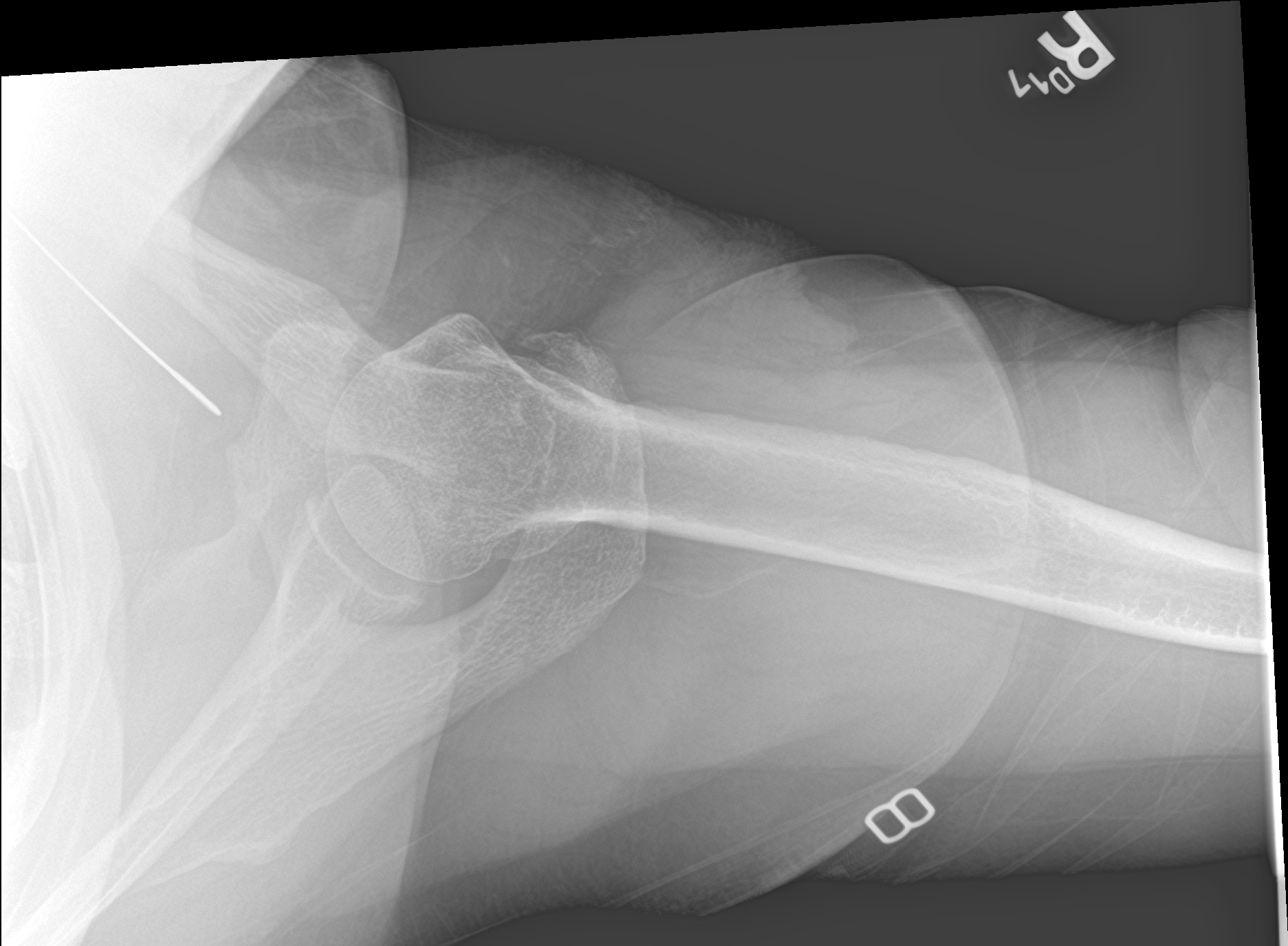

[3 of 3 positions shown; findings below may reference images not displayed]

FINDINGS: Mild glenohumeral spurring with preservation of joint space. There
is mild acromioclavicular degenerative change with spurring.
Evidence of fracture, focal bone lesion, avascular necrosis or bony
destruction. There is subcortical cystic change in the lateral
humeral head. No soft tissue calcifications are seen.
IMPRESSION: 1. Mild acromioclavicular and glenohumeral degenerative change.
2. Subcortical cystic change in the lateral humeral head suggesting
underlying rotator cuff arthropathy.

## 2022-10-18 DIAGNOSIS — H16002 Unspecified corneal ulcer, left eye: Secondary | ICD-10-CM | POA: Diagnosis not present

## 2023-05-19 ENCOUNTER — Ambulatory Visit (HOSPITAL_COMMUNITY)
Admission: EM | Admit: 2023-05-19 | Discharge: 2023-05-19 | Disposition: A | Discharge status: Home / Self Care | Payer: Medicare HMO | Attending: Emergency Medicine | Admitting: Emergency Medicine

## 2023-05-19 ENCOUNTER — Ambulatory Visit (INDEPENDENT_AMBULATORY_CARE_PROVIDER_SITE_OTHER): Payer: Medicare HMO

## 2023-05-19 ENCOUNTER — Encounter (HOSPITAL_COMMUNITY): Payer: Self-pay

## 2023-05-19 DIAGNOSIS — M85871 Other specified disorders of bone density and structure, right ankle and foot: Secondary | ICD-10-CM | POA: Diagnosis not present

## 2023-05-19 DIAGNOSIS — M79671 Pain in right foot: Secondary | ICD-10-CM

## 2023-05-19 DIAGNOSIS — S99921A Unspecified injury of right foot, initial encounter: Secondary | ICD-10-CM | POA: Diagnosis not present

## 2023-05-19 NOTE — Discharge Instructions (Addendum)
The official radiology read on your x-ray did not show any acute fractures or dislocations.  You can continue to buddy tape your toes if this feels good for you and wearing the postop shoe throughout the day to help with pain while walking.  Rest and elevate your foot.  If you continue to have pain beyond the next week or so please follow-up with an orthopedic for further evaluation.

## 2023-05-19 NOTE — ED Provider Notes (Signed)
[facility]    CSN: [id number] Arrival date & time: 05/19/23  1740      History   Chief Complaint Chief Complaint  Patient presents with   Foot Pain    HPI Julie Flowers is a 67 y.o. female.   Patient presents to clinic for right foot pain along her fourth toe.  Around 2 weeks ago she was trying to get out of bed and hit the outside of her right foot on a stool.  She did not have any breaks in the skin, no swelling or bruising.   Buddy taping the toes does help.  She has tried ibuprofen without much relief.  Feels like the pain is getting worse when she walks.  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  She is ambulatory.  The history is provided by the patient and medical records.  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   Viral hepatitis B without hepatic coma 10/14/2020   BMI 38.0-38.9,adult 10/14/2020   Pneumonia due to COVID-19 virus 06/15/2019   AKI (acute kidney injury) (HCC) 06/15/2019   Dehydration 06/15/2019    Past Surgical History:  Procedure Laterality Date   BREAST LUMPECTOMY Right 10+ years ago   Benign   CYSTECTOMY  10 years ago   wrist    LAPAROSCOPIC VAGINAL HYSTERECTOMY  15 years ago   due to fibroids    OB History     Gravida  1   Para  1   Term  1   Preterm      AB      Living  1      SAB      IAB       Ectopic      Multiple      Live Births  1            Home Medications    Prior to Admission medications   Medication Sig Start Date End Date Taking? Authorizing Provider  ibuprofen (ADVIL) 200 MG tablet Take 200 mg by mouth every 6 (six) hours as needed.   Yes [provider]    Family History Family History  Problem Relation Age of Onset   Obesity Mother    Hypertension Mother    Diabetes Mother    Leukemia Maternal Uncle    Diabetes Maternal Uncle    Obesity Other     Social History Social History   Tobacco Use   Smoking status: Never   Smokeless tobacco: Never  Vaping Use   Vaping status: Never Used  Substance Use Topics   Alcohol use: Never   Drug use: Never     Allergies   Patient has no known allergies.   Review of Systems  Review of Systems  Per HPI   Physical Exam Triage Vital Signs ED Triage Vitals  Encounter Vitals Group     BP 05/19/23 1756 (!) 143/66     Systolic BP Percentile --      Diastolic BP Percentile --      Pulse Rate 05/19/23 1756 83     Resp 05/19/23 1756 16     Temp 05/19/23 1756 98.1 F (36.7 C)     Temp Source 05/19/23 1756 Oral     SpO2 05/19/23 1756 96 %     Weight 05/19/23 1756 162 lb (73.5 kg)     Height 05/19/23 1756 5\' 5"  (1.651 m)     Head Circumference --      Peak Flow --      Pain Score 05/19/23 1755 8     Pain Loc --      Pain Education --      Exclude from Growth Chart --    No data found.  Updated Vital Signs BP (!) 143/66 (BP Location: Right Arm)   Pulse 83   Temp 98.1 F (36.7 C) (Oral)   Resp 16   Ht 5\' 5"  (1.651 m)   Wt 162 lb (73.5 kg)   LMP 07/24/2003   SpO2 96%   BMI 26.96 kg/m   Visual Acuity Right Eye Distance:   Left Eye Distance:   Bilateral Distance:    Right Eye Near:   Left Eye Near:    Bilateral Near:     Physical Exam Vitals and nursing note reviewed.  Constitutional:      Appearance: Normal appearance.  HENT:     Head: Normocephalic and atraumatic.      Right Ear: External ear normal.     Left Ear: External ear normal.     Nose: Nose normal.     Mouth/Throat:     Mouth: Mucous membranes are moist.  Eyes:     Conjunctiva/sclera: Conjunctivae normal.  Cardiovascular:     Rate and Rhythm: Normal rate.     Pulses:          Dorsalis pedis pulses are 2+ on the right side.       Posterior tibial pulses are 2+ on the right side.  Pulmonary:     Effort: Pulmonary effort is normal. No respiratory distress.  Musculoskeletal:        General: Tenderness present. No swelling or deformity. Normal range of motion.       Feet:  Feet:     Right foot:     Skin integrity: Skin integrity normal.     Comments: Mild TTP over 4th and 3rd metatarsal.  No obvious bruising, swelling or deformity.  No warmth or streaking. Skin:    General: Skin is warm and dry.     Capillary Refill: Capillary refill takes less than 2 seconds.  Neurological:     General: No focal deficit present.     Mental Status: She is alert and oriented to person, place, and time.  Psychiatric:        Mood and Affect: Mood normal.        Behavior: Behavior normal.      UC Treatments / Results  Labs (all labs ordered are listed, but only abnormal results are displayed) Labs Reviewed - No data to display  EKG   Radiology DG Foot Complete Right Result Date: 05/19/2023 CLINICAL DATA:  Trauma to the right foot.  Pain. EXAM: RIGHT FOOT COMPLETE - 3+ VIEW  COMPARISON:  None Available. FINDINGS: There is no acute fracture or dislocation. The bones are osteopenic. The soft tissues are grossly unremarkable. IMPRESSION: 1. No acute fracture or dislocation. 2. Osteopenia. Electronically Signed   By: Elgie Collard M.D.   On: 05/19/2023 18:52    Procedures Procedures (including critical care time)  Medications Ordered in UC Medications - No data to display  Initial Impression / Assessment and Plan / UC Course  I have reviewed the triage vital signs and the nursing  notes.  Pertinent labs & imaging results that were available during my care of the patient were reviewed by me and considered in my medical decision making (see chart for details).  Vitals and triage reviewed, patient is hemodynamically stable.  Imaging does not reveal any acute fractures.  Low concern for cellulitis as no warmth or streaking or breaks in skin.  No obvious swelling or deformity.  Provided postop shoe in clinic to help with comfort as well as buddy taping.  Orthopedic follow-up as needed.  Plan of care, follow-up care return precautions given, no questions at this time.     Final Clinical Impressions(s) / UC Diagnoses   Final diagnoses:  Right foot pain     Discharge Instructions      The official radiology read on your x-ray did not show any acute fractures or dislocations.  You can continue to buddy tape your toes if this feels good for you and wearing the postop shoe throughout the day to help with pain while walking.  Rest and elevate your foot.  If you continue to have pain beyond the next week or so please follow-up with an orthopedic for further evaluation.    ED Prescriptions   None    PDMP not reviewed this encounter.   Catalena Stanhope, [place], [place] 05/19/23 [phone]

## 2023-05-19 NOTE — ED Triage Notes (Signed)
Right Foot Pain X2 weeks. Patient hit her foot on a stool while getting out of the bed. No open wound. Patient has been rapping the toes, buddy tape, with no relief.   Patient tried ibuprofen with no relief.

## 2023-10-17 DIAGNOSIS — E559 Vitamin D deficiency, unspecified: Secondary | ICD-10-CM | POA: Diagnosis not present

## 2023-10-17 DIAGNOSIS — E785 Hyperlipidemia, unspecified: Secondary | ICD-10-CM | POA: Diagnosis not present

## 2023-10-17 DIAGNOSIS — Z0000 Encounter for general adult medical examination without abnormal findings: Secondary | ICD-10-CM | POA: Diagnosis not present

## 2023-10-17 DIAGNOSIS — Z131 Encounter for screening for diabetes mellitus: Secondary | ICD-10-CM | POA: Diagnosis not present

## 2023-10-31 DIAGNOSIS — E782 Mixed hyperlipidemia: Secondary | ICD-10-CM | POA: Diagnosis not present

## 2023-10-31 DIAGNOSIS — E559 Vitamin D deficiency, unspecified: Secondary | ICD-10-CM | POA: Diagnosis not present

## 2023-12-08 ENCOUNTER — Encounter: Payer: Self-pay | Admitting: Advanced Practice Midwife

## 2024-05-01 DIAGNOSIS — E559 Vitamin D deficiency, unspecified: Secondary | ICD-10-CM | POA: Diagnosis not present

## 2024-05-01 DIAGNOSIS — E782 Mixed hyperlipidemia: Secondary | ICD-10-CM | POA: Diagnosis not present

## 2024-06-24 ENCOUNTER — Emergency Department (HOSPITAL_COMMUNITY)

## 2024-06-24 ENCOUNTER — Emergency Department (HOSPITAL_COMMUNITY)
Admission: EM | Admit: 2024-06-24 | Discharge: 2024-06-24 | Disposition: A | Discharge status: Home / Self Care | Attending: Emergency Medicine | Admitting: Emergency Medicine

## 2024-06-24 DIAGNOSIS — R0789 Other chest pain: Secondary | ICD-10-CM | POA: Insufficient documentation

## 2024-06-24 DIAGNOSIS — Y92410 Unspecified street and highway as the place of occurrence of the external cause: Secondary | ICD-10-CM | POA: Insufficient documentation

## 2024-06-24 DIAGNOSIS — M542 Cervicalgia: Secondary | ICD-10-CM | POA: Insufficient documentation

## 2024-06-24 LAB — TROPONIN T, HIGH SENSITIVITY: Troponin T High Sensitivity: <6 ng/L (ref 0–19)

## 2024-06-24 MED ORDER — LIDOCAINE 5 % EX PTCH
1.0000 | MEDICATED_PATCH | CUTANEOUS | Status: DC
  Administered 2024-06-24: 1 via TRANSDERMAL
  Filled 2024-06-24: qty 1

## 2024-06-24 MED ORDER — ACETAMINOPHEN 325 MG PO TABS
650.0000 mg | ORAL_TABLET | Freq: Once | ORAL | Status: AC
  Administered 2024-06-24: 650 mg via ORAL
  Filled 2024-06-24: qty 2
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
